# Patient Record
Sex: Female | Born: 1935 | Race: White | Hispanic: No | Smoking: Never smoker
Health system: Southern US, Community
[De-identification: ages and names within clinical notes are randomized; demographics above are authoritative.]

## PROBLEM LIST (undated history)

## (undated) DIAGNOSIS — M810 Age-related osteoporosis without current pathological fracture: Secondary | ICD-10-CM

## (undated) DIAGNOSIS — F0390 Unspecified dementia without behavioral disturbance: Secondary | ICD-10-CM

## (undated) DIAGNOSIS — I10 Essential (primary) hypertension: Secondary | ICD-10-CM

## (undated) DIAGNOSIS — E079 Disorder of thyroid, unspecified: Secondary | ICD-10-CM

## (undated) DIAGNOSIS — F419 Anxiety disorder, unspecified: Secondary | ICD-10-CM

## (undated) DIAGNOSIS — E7800 Pure hypercholesterolemia, unspecified: Secondary | ICD-10-CM

## (undated) HISTORY — PX: ABDOMINAL HYSTERECTOMY: SUR658

## (undated) HISTORY — PX: BREAST BIOPSY: SHX20

## (undated) HISTORY — PX: OTHER SURGICAL HISTORY: SHX169

## (undated) HISTORY — PX: APPENDECTOMY: SHX54

## (undated) HISTORY — DX: Pure hypercholesterolemia, unspecified: E78.00

---

## 2017-04-13 ENCOUNTER — Encounter (HOSPITAL_BASED_OUTPATIENT_CLINIC_OR_DEPARTMENT_OTHER): Payer: Self-pay | Admitting: *Deleted

## 2017-04-13 ENCOUNTER — Emergency Department (HOSPITAL_BASED_OUTPATIENT_CLINIC_OR_DEPARTMENT_OTHER)
Admission: EM | Admit: 2017-04-13 | Discharge: 2017-04-13 | Disposition: A | Discharge status: Home / Self Care | Payer: Medicare Other | Attending: Emergency Medicine | Admitting: Emergency Medicine

## 2017-04-13 DIAGNOSIS — I889 Nonspecific lymphadenitis, unspecified: Secondary | ICD-10-CM | POA: Diagnosis not present

## 2017-04-13 DIAGNOSIS — F0390 Unspecified dementia without behavioral disturbance: Secondary | ICD-10-CM | POA: Diagnosis not present

## 2017-04-13 DIAGNOSIS — I10 Essential (primary) hypertension: Secondary | ICD-10-CM | POA: Insufficient documentation

## 2017-04-13 DIAGNOSIS — Z79899 Other long term (current) drug therapy: Secondary | ICD-10-CM | POA: Insufficient documentation

## 2017-04-13 DIAGNOSIS — M542 Cervicalgia: Secondary | ICD-10-CM | POA: Diagnosis present

## 2017-04-13 HISTORY — DX: Anxiety disorder, unspecified: F41.9

## 2017-04-13 HISTORY — DX: Age-related osteoporosis without current pathological fracture: M81.0

## 2017-04-13 HISTORY — DX: Unspecified dementia, unspecified severity, without behavioral disturbance, psychotic disturbance, mood disturbance, and anxiety: F03.90

## 2017-04-13 HISTORY — DX: Essential (primary) hypertension: I10

## 2017-04-13 HISTORY — DX: Disorder of thyroid, unspecified: E07.9

## 2017-04-13 MED ORDER — CEPHALEXIN 500 MG PO CAPS: 500.0000 mg | ORAL_CAPSULE | Freq: Four times a day (QID) | ORAL | 0 refills | Status: DC

## 2017-04-13 NOTE — ED Triage Notes (Signed)
Pt c/o right swelling around right ear pain pain x 1 day

## 2017-04-13 NOTE — Discharge Instructions (Signed)
Keflex as prescribed.  Ibuprofen 400 mg every 6 hours as needed for pain.  Follow up with your primary Dr. if not improving in the next 1-2 weeks, and return to the emergency department if you develop difficulty breathing or swallowing, or a significant worsening of your symptoms.

## 2017-04-13 NOTE — ED Provider Notes (Signed)
[facility] Provider Note   CSN: [id number] Arrival date & time: 04/13/17  2045     History   Chief Complaint Chief Complaint  Patient presents with  . Neck Pain    HPI Alicia Benitez is a 81 y.o. female.  Patient is a 81 year old female with history of hypertension and thyroid disease presenting for evaluation of pain and swelling to the right neck. This started this morning in the absence of any injury or trauma. She denies any fevers, chills, difficulty eating, difficulty swallowing, or difficulty breathing. She denies any ill contacts and any recent illnesses.   The history is provided by the patient.  Neck Pain   This is a new problem. Episode onset: This morning. The problem occurs constantly. The problem has been gradually worsening. The pain is associated with nothing. There has been no fever. Pain location: Right neck. The quality of the pain is described as aching. The pain is moderate.    Past Medical History:  Diagnosis Date  . Anxiety   . Dementia   . Hypertension   . Osteoporosis   . Thyroid disease     There are no active problems to display for this patient.   No past surgical history on file.  OB History    No data available       Home Medications    Prior to Admission medications   Medication Sig Start Date End Date Taking? Authorizing Provider  alendronate (FOSAMAX) 70 MG tablet Take 70 mg by mouth once a week. Take with a full glass of water on an empty stomach.   Yes [provider]  donepezil (ARICEPT) 10 MG tablet Take 10 mg by mouth at bedtime.   Yes [provider]  levothyroxine (SYNTHROID, LEVOTHROID) 75 MCG tablet Take 75 mcg by mouth daily before breakfast.   Yes [provider]  losartan-hydrochlorothiazide (HYZAAR) 100-25 MG tablet Take 1 tablet by mouth daily.   Yes [provider]  memantine (NAMENDA) 5 MG tablet Take 28 mg by mouth daily.   Yes [provider]  PARoxetine  (PAXIL) 10 MG tablet Take 10 mg by mouth daily.   Yes [provider]  polyethylene glycol (MIRALAX / GLYCOLAX) packet Take 17 g by mouth daily.   Yes [provider]  potassium chloride (MICRO-K) 10 MEQ CR capsule Take 20 mEq by mouth 2 (two) times daily.   Yes [provider]    Family History No family history on file.  Social History Social History  Substance Use Topics  . Smoking status: Not on file  . Smokeless tobacco: Not on file  . Alcohol use Not on file     Allergies   Patient has no known allergies.   Review of Systems Review of Systems  Musculoskeletal: Positive for neck pain.  All other systems reviewed and are negative.    Physical Exam Updated Vital Signs BP (!) 142/75   Pulse 65   Temp 98.5 F (36.9 C)   Resp 16   Ht 5\' 5"  (1.651 m)   Wt 51.3 kg (113 lb)   SpO2 98%   BMI 18.80 kg/m   Physical Exam  Constitutional: She is oriented to person, place, and time. She appears well-developed and well-nourished. No distress.  HENT:  Head: Normocephalic and atraumatic.  Mouth/Throat: Oropharynx is clear and moist.  There is some fullness and swelling to the right lateral neck just adjacent to the angle of the mandible. There is no stridor.  There is no trismus. Oropharynx is clear with no redness, inflammation, or exudate.  Neck: Normal range of motion. Neck supple.  Cardiovascular: Normal rate and regular rhythm.  Exam reveals no gallop and no friction rub.   No murmur heard. Pulmonary/Chest: Effort normal and breath sounds normal. No respiratory distress. She has no wheezes.  Abdominal: Soft. Bowel sounds are normal. She exhibits no distension. There is no tenderness.  Musculoskeletal: Normal range of motion.  Neurological: She is alert and oriented to person, place, and time.  Skin: Skin is warm and dry. She is not diaphoretic.  Nursing note and vitals reviewed.    ED Treatments / Results  Labs (all labs ordered are  listed, but only abnormal results are displayed) Labs Reviewed - No data to display  EKG  EKG Interpretation None       Radiology No results found.  Procedures Procedures (including critical care time)  Medications Ordered in ED Medications - No data to display   Initial Impression / Assessment and Plan / ED Course  I have reviewed the triage vital signs and the nursing notes.  Pertinent labs & imaging results that were available during my care of the patient were reviewed by me and considered in my medical decision making (see chart for details).  This appears to be an inflamed submandibular lymph node. This will be treated with Keflex, anti-inflammatories, and follow-up as needed if not improving.  Final Clinical Impressions(s) / ED Diagnoses   Final diagnoses:  None    New Prescriptions New Prescriptions   No medications on file     Geoffery Lyonselo, Andrewjames Weirauch, MD 04/13/17 2306

## 2018-10-21 DIAGNOSIS — S22009A Unspecified fracture of unspecified thoracic vertebra, initial encounter for closed fracture: Secondary | ICD-10-CM | POA: Insufficient documentation

## 2018-11-08 DIAGNOSIS — S32009A Unspecified fracture of unspecified lumbar vertebra, initial encounter for closed fracture: Secondary | ICD-10-CM | POA: Insufficient documentation

## 2018-11-13 ENCOUNTER — Non-Acute Institutional Stay (SKILLED_NURSING_FACILITY): Payer: Medicare Other | Admitting: Internal Medicine

## 2018-11-13 ENCOUNTER — Encounter: Payer: Self-pay | Admitting: Internal Medicine

## 2018-11-13 DIAGNOSIS — S42191D Fracture of other part of scapula, right shoulder, subsequent encounter for fracture with routine healing: Secondary | ICD-10-CM

## 2018-11-13 DIAGNOSIS — G301 Alzheimer's disease with late onset: Secondary | ICD-10-CM

## 2018-11-13 DIAGNOSIS — E034 Atrophy of thyroid (acquired): Secondary | ICD-10-CM

## 2018-11-13 DIAGNOSIS — F02818 Dementia in other diseases classified elsewhere, unspecified severity, with other behavioral disturbance: Secondary | ICD-10-CM

## 2018-11-13 DIAGNOSIS — R0489 Hemorrhage from other sites in respiratory passages: Secondary | ICD-10-CM | POA: Diagnosis not present

## 2018-11-13 DIAGNOSIS — I10 Essential (primary) hypertension: Secondary | ICD-10-CM

## 2018-11-13 DIAGNOSIS — S2249XA Multiple fractures of ribs, unspecified side, initial encounter for closed fracture: Secondary | ICD-10-CM

## 2018-11-13 DIAGNOSIS — F32A Depression, unspecified: Secondary | ICD-10-CM

## 2018-11-13 DIAGNOSIS — S129XXD Fracture of neck, unspecified, subsequent encounter: Secondary | ICD-10-CM

## 2018-11-13 DIAGNOSIS — K219 Gastro-esophageal reflux disease without esophagitis: Secondary | ICD-10-CM

## 2018-11-13 DIAGNOSIS — F329 Major depressive disorder, single episode, unspecified: Secondary | ICD-10-CM

## 2018-11-13 DIAGNOSIS — F0281 Dementia in other diseases classified elsewhere with behavioral disturbance: Secondary | ICD-10-CM

## 2018-11-13 DIAGNOSIS — S32010D Wedge compression fracture of first lumbar vertebra, subsequent encounter for fracture with routine healing: Secondary | ICD-10-CM

## 2018-11-13 NOTE — Progress Notes (Signed)
:  Location:  [facility] Room Number: 103P Place of Service:  SNF (31)  Alicia Benitez D. Lyn Hollingshead, MD  Patient Care Team: Raynelle Jan., MD as PCP - General (Family Medicine)  Extended Emergency Contact Information Primary Emergency Contact: [place] Home Phone: [phone] Mobile Phone: [phone] Relation: Daughter     Allergies: Phenytoin and Yellow jacket venom [bee venom]  Chief Complaint  Patient presents with  . New Admit To SNF    Admit to [facility]    HPI: Patient is 83 y.o. female with anxiety, dementia, hyperlipidemia, hypertension, osteoporosis, and hypothyroidism who presented to [facility] ED with complaints of injury sustained after presumed fall.  EMS reports that nursing home staff stated the patient was found on the floor yesterday after presumed fall.  They did not seek care for her at that time.  Someone changed her diaper sometime in the hour prior to admission and saw that she had severe bruising to her right hip and right lower back.  In the ED patient was found to have multiple fractures.  Patient was admitted to [facility] from 2/14-18 where she was found to have a closed fracture/dislocation of lumbar spine, not a surgical candidate, thoracic spine fracture, fracture multiple ribs on the right side, and reports dementia.  Patient was treated for pain and there were apparently no complications.  Patient is admitted to skilled nursing facility for OT/PT.  While at skilled nursing facility patient will be followed for hypothyroidism treated with replacement, hypertension treated with lisinopril and metoprolol, and depression treated with Paxil. Past Medical History:  Diagnosis Date  . Anxiety   . Dementia (HCC)   . Hypercholesteremia   . Hypertension   . Osteoporosis   . Thyroid disease       Allergies as of 11/13/2018      Reactions   Phenytoin    Yellow Jacket  Venom [bee Venom]       Medication List       Accurate as of November 13, 2018 11:59 PM. Always use your most recent med list.        acetaminophen 325 MG tablet Commonly known as:  TYLENOL Take 650 mg by mouth every 8 (eight) hours as needed.   famotidine 20 MG tablet Commonly known as:  PEPCID Take 20 mg by mouth 2 (two) times daily.   levothyroxine 88 MCG tablet Commonly known as:  SYNTHROID, LEVOTHROID Take 75 mcg by mouth daily before breakfast.   lisinopril 20 MG tablet Commonly known as:  PRINIVIL,ZESTRIL Take 20 mg by mouth daily.   metoprolol tartrate 25 MG tablet Commonly known as:  LOPRESSOR Take 25 mg by mouth 2 (two) times daily.   pantoprazole 40 MG tablet Commonly known as:  PROTONIX Take 40 mg by mouth daily.   PARoxetine 30 MG tablet Commonly known as:  PAXIL Take 10 mg by mouth daily.   potassium chloride 10 MEQ CR capsule Commonly known as:  MICRO-K Take 20 mEq by mouth 2 (two) times daily.   QUEtiapine 25 MG tablet Commonly known as:  SEROQUEL Take 12.5 mg by mouth 2 (two) times daily.       No orders of the defined types were placed in this encounter.    There is no immunization history on file for this patient.  Social History   Tobacco Use  . Smoking status: Never Smoker  . Smokeless tobacco: Never Used  Substance Use Topics  . Alcohol use: Not on file    Comment: No    Family history is   Family History  Problem Relation Age of Onset  . Hyperlipidemia Mother   . Diabetes Brother   . Cancer Son       Review of Systems  DATA OBTAINED: from patient, nurse GENERAL:  no fevers, fatigue, appetite changes SKIN: No itching, or rash EYES: No eye pain, redness, discharge EARS: No earache, tinnitus, change in hearing NOSE: No congestion, drainage or bleeding  MOUTH/THROAT: No mouth or tooth pain, No sore throat RESPIRATORY: No cough, wheezing, SOB CARDIAC: No chest pain, palpitations, lower extremity edema  GI: No  abdominal pain, No N/V/D or constipation, No heartburn or reflux  GU: No dysuria, frequency or urgency, or incontinence  MUSCULOSKELETAL: No unrelieved bone/joint pain NEUROLOGIC: No headache, dizziness or focal weakness PSYCHIATRIC: No c/o anxiety or sadness   Vitals:   11/13/18 1403  BP: 126/78  Pulse: 63  Resp: 18  Temp: (!) 96.5 F (35.8 C)    SpO2 Readings from Last 1 Encounters:  04/13/17 98%   Body mass index is 18.8 kg/m.     Physical Exam  GENERAL APPEARANCE: Alert, conversant,  No acute distress.  SKIN: No diaphoresis rash HEAD: Normocephalic, atraumatic  EYES: Conjunctiva/lids clear. Pupils round, reactive. EOMs intact.  EARS: External exam WNL, canals clear. Hearing grossly normal.  NOSE: No deformity or discharge.  MOUTH/THROAT: Lips w/o lesions  RESPIRATORY: Breathing is even, unlabored. Lung sounds are clear   CARDIOVASCULAR: Heart RRR no murmurs, rubs or gallops. No peripheral edema.   GASTROINTESTINAL: Abdomen is soft, non-tender, not distended w/ normal bowel sounds. GENITOURINARY: Bladder non tender, not distended  MUSCULOSKELETAL: No abnormal joints or musculature NEUROLOGIC:  Cranial nerves 2-12 grossly intact. Moves all extremities  PSYCHIATRIC: Mood and affect with dementia, no behavioral issues  There are no active problems to display for this patient.     Labs reviewed: Basic Metabolic Panel: No results found for: NA, K, CL, CO2, GLUCOSE, BUN, CREATININE, CALCIUM, PROT, ALBUMIN, AST, ALT, ALKPHOS, BILITOT, GFRNONAA, GFRAA  No results for input(s): NA, K, CL, CO2, GLUCOSE, BUN, CREATININE, CALCIUM, MG, PHOS in the last 8760 hours. Liver Function Tests: No results for input(s): AST, ALT, ALKPHOS, BILITOT, PROT, ALBUMIN in the last 8760 hours. No results for input(s): LIPASE, AMYLASE in the last 8760 hours. No results for input(s): AMMONIA in the last 8760 hours. CBC: No results for input(s): WBC, NEUTROABS, HGB, HCT, MCV, PLT in the last  8760 hours. Lipid No results for input(s): CHOL, HDL, LDLCALC, TRIG in the last 8760 hours.  Cardiac Enzymes: No results for input(s): CKTOTAL, CKMB, CKMBINDEX, TROPONINI in the last 8760 hours. BNP: No results for input(s): BNP in the last 8760 hours. No results found for: MICROALBUR No results found for: HGBA1C No results found for: TSH No results found for: VITAMINB12 No results found for: FOLATE No results found for: IRON, TIBC, FERRITIN  Imaging and Procedures obtained prior to SNF admission: No results found.   Not all labs, radiology exams or other studies done during hospitalization come through on my EPIC note; however they are reviewed by me.    Assessment and Plan  Right rib fractures with displacement 2 - 6/subpleural hemorrhage/T2-T7 right transverse process fractures nondisplaced/right inferior scapular fracture/L1 compression fracture with mild retropulsion- Ortho recommended c-collar to be worn at all times in a Jewett brace to be worn when patient is out of bed SNF-admitted for  OT/PT; follow-up CBC  Hypertension SNF- controlled; continue metoprolol 25 mg twice daily and lisinopril 20 mg daily  Hypothyroidism SNF- not stated as uncontrolled; continue 88 mcg daily  Depression SNF- not stated as uncontrolled; continue Paxil 30 mg daily  GERD SNF- continue Pepcid 20 mg twice daily and Protonix 40 mg daily  Dementia with behaviors SNF- continue with Seroquel 12.5 mg nightly, patient was on Aricept prior but none now   Time spent greater than 45 minutes;> 50% of time with patient was spent reviewing records, labs, tests and studies, counseling and developing plan of care  Thurston Holenne D. Lyn HollingsheadAlexander, MD

## 2018-11-15 ENCOUNTER — Encounter: Payer: Self-pay | Admitting: Internal Medicine

## 2018-11-19 LAB — BASIC METABOLIC PANEL
BUN: 19 (ref 4–21)
CREATININE: 0.7 (ref 0.5–1.1)
Glucose: 92
POTASSIUM: 4.2 (ref 3.4–5.3)
Sodium: 137 (ref 137–147)

## 2018-11-19 LAB — CBC AND DIFFERENTIAL
HCT: 23 — AB (ref 36–46)
HEMOGLOBIN: 7.8 — AB (ref 12.0–16.0)
Platelets: 366 (ref 150–399)
WBC: 4.7

## 2018-12-13 ENCOUNTER — Encounter: Payer: Self-pay | Admitting: Internal Medicine

## 2018-12-13 ENCOUNTER — Non-Acute Institutional Stay (SKILLED_NURSING_FACILITY): Payer: Medicare Other | Admitting: Internal Medicine

## 2018-12-13 DIAGNOSIS — I10 Essential (primary) hypertension: Secondary | ICD-10-CM | POA: Diagnosis not present

## 2018-12-13 DIAGNOSIS — F0281 Dementia in other diseases classified elsewhere with behavioral disturbance: Secondary | ICD-10-CM | POA: Diagnosis not present

## 2018-12-13 DIAGNOSIS — G300 Alzheimer's disease with early onset: Secondary | ICD-10-CM | POA: Diagnosis not present

## 2018-12-13 DIAGNOSIS — F02818 Dementia in other diseases classified elsewhere, unspecified severity, with other behavioral disturbance: Secondary | ICD-10-CM

## 2018-12-13 DIAGNOSIS — E034 Atrophy of thyroid (acquired): Secondary | ICD-10-CM

## 2018-12-13 NOTE — Progress Notes (Signed)
Location:  [facility] Room Number: 103P Place of Service:  SNF (31)  Randon Goldsmith. Lyn Hollingshead, MD  Patient Care Team: Raynelle Jan., MD as PCP - General (Family Medicine)  Extended Emergency Contact Information Primary Emergency Contact: [place] Home Phone: [phone] Mobile Phone: [phone] Relation: Daughter    Allergies: Phenytoin and Yellow jacket venom [bee venom]  Chief Complaint  Patient presents with  . Medical Management of Chronic Issues    Routine visit    HPI: Patient is 83 y.o. female who is being seen for routine issues of hypertension, hypothyroidism, and Alzheimer's disease.  Past Medical History:  Diagnosis Date  . Anxiety   . Dementia (HCC)   . Hypercholesteremia   . Hypertension   . Osteoporosis   . Thyroid disease     Past Surgical History:  Procedure Laterality Date  . ABDOMINAL HYSTERECTOMY    . APPENDECTOMY    . BREAST BIOPSY Left   . excision of meckel diverticulum    . IM Nailing Hip fracture    . salpingophorectomy      Allergies as of 12/13/2018      Reactions   Phenytoin    Yellow Jacket Venom [bee Venom]       Medication List       Accurate as of December 13, 2018 11:59 PM. Always use your most recent med list.        acetaminophen 325 MG tablet Commonly known as:  TYLENOL Take 650 mg by mouth every 8 (eight) hours as needed.   divalproex 125 MG DR tablet Commonly known as:  DEPAKOTE Take 125 mg by mouth 2 (two) times daily.   famotidine 20 MG tablet Commonly known as:  PEPCID Take 20 mg by mouth 2 (two) times daily.   levothyroxine 88 MCG tablet Commonly known as:  SYNTHROID, LEVOTHROID Take 75 mcg by mouth daily before breakfast.   lisinopril 20 MG tablet Commonly known as:  PRINIVIL,ZESTRIL Take 20 mg by mouth daily.   metoprolol tartrate 25 MG tablet Commonly known as:  LOPRESSOR Take 25 mg by mouth 2 (two) times daily.   PARoxetine 30 MG  tablet Commonly known as:  PAXIL Take 10 mg by mouth daily.   potassium chloride 10 MEQ CR capsule Commonly known as:  MICRO-K Take 10 mEq by mouth 2 (two) times daily.       No orders of the defined types were placed in this encounter.    There is no immunization history on file for this patient.  Social History   Tobacco Use  . Smoking status: Never Smoker  . Smokeless tobacco: Never Used  Substance Use Topics  . Alcohol use: Not on file    Comment: No    Review of Systems  DATA OBTAINED: from patient, nurse GENERAL:  no fevers, fatigue, appetite changes SKIN: No itching, rash HEENT: No complaint RESPIRATORY: No cough, wheezing, SOB CARDIAC: No chest pain, palpitations, lower extremity edema  GI: No abdominal pain, No N/V/D or constipation, No heartburn or reflux  GU: No dysuria, frequency or urgency, or incontinence  MUSCULOSKELETAL: No unrelieved bone/joint pain NEUROLOGIC: No headache, dizziness  PSYCHIATRIC: No overt anxiety or sadness  Vitals:   12/13/18 1010  BP: 131/79  Pulse: 69  Resp: 17  Temp: 98 F (36.7 C)   Body mass index is 20.27 kg/m. Physical Exam  GENERAL APPEARANCE: Alert, conversant, No acute distress  SKIN: No diaphoresis rash HEENT:  Unremarkable RESPIRATORY: Breathing is even, unlabored. Lung sounds are clear   CARDIOVASCULAR: Heart RRR no murmurs, rubs or gallops. No peripheral edema  GASTROINTESTINAL: Abdomen is soft, non-tender, not distended w/ normal bowel sounds.  GENITOURINARY: Bladder non tender, not distended  MUSCULOSKELETAL: No abnormal joints or musculature NEUROLOGIC: Cranial nerves 2-12 grossly intact. Moves all extremities PSYCHIATRIC: Mood and affect with dementia, no behavioral issues  Patient Active Problem List   Diagnosis Date Noted  . Compression fracture of L1 lumbar vertebra (HCC) 12/21/2018  . Closed fracture of transverse process of thoracic vertebra (HCC) 12/21/2018  . Hypertension 12/21/2018  .  Hypothyroidism 12/21/2018  . Depression, major, single episode, in partial remission (HCC) 12/21/2018  . GERD (gastroesophageal reflux disease) 12/21/2018  . Dementia with behavioral disturbance (HCC) 12/21/2018  . Multiple rib fractures 12/21/2018  . Lung contusion 12/21/2018  . Closed fracture dislocation of lumbar spine (HCC) 11/08/2018  . Thoracic spine fracture (HCC) 10/21/2018    CMP     Component Value Date/Time   NA 137 11/19/2018   K 4.2 11/19/2018   BUN 19 11/19/2018   CREATININE 0.7 11/19/2018   Recent Labs    11/19/18  NA 137  K 4.2  BUN 19  CREATININE 0.7   No results for input(s): AST, ALT, ALKPHOS, BILITOT, PROT, ALBUMIN in the last 8760 hours. Recent Labs    11/19/18  WBC 4.7  HGB 7.8*  HCT 23*  PLT 366   No results for input(s): CHOL, LDLCALC, TRIG in the last 8760 hours.  Invalid input(s): HCL No results found for: MICROALBUR No results found for: TSH No results found for: HGBA1C No results found for: CHOL, HDL, LDLCALC, LDLDIRECT, TRIG, CHOLHDL  Significant Diagnostic Results in last 30 days:  No results found.  Assessment and Plan  Depression, major, single episode, in partial remission (HCC) Appears controlled; continue Paxil 10 mg daily  Multiple rib fractures Patient appears to be improving without difficulty: Patient has Tylenol only for pain and seems comfortable enough with that  Hypothyroidism Not stated as uncontrolled; continue Synthroid 88 mcg daily     Torria Fromer D. Lyn Hollingshead, MD

## 2018-12-19 ENCOUNTER — Non-Acute Institutional Stay (SKILLED_NURSING_FACILITY): Payer: Medicare Other | Admitting: Adult Health

## 2018-12-19 ENCOUNTER — Encounter: Payer: Self-pay | Admitting: Adult Health

## 2018-12-19 DIAGNOSIS — S22009S Unspecified fracture of unspecified thoracic vertebra, sequela: Secondary | ICD-10-CM | POA: Diagnosis not present

## 2018-12-19 DIAGNOSIS — G300 Alzheimer's disease with early onset: Secondary | ICD-10-CM

## 2018-12-19 DIAGNOSIS — F02818 Dementia in other diseases classified elsewhere, unspecified severity, with other behavioral disturbance: Secondary | ICD-10-CM

## 2018-12-19 DIAGNOSIS — S32009S Unspecified fracture of unspecified lumbar vertebra, sequela: Secondary | ICD-10-CM

## 2018-12-19 DIAGNOSIS — F0281 Dementia in other diseases classified elsewhere with behavioral disturbance: Secondary | ICD-10-CM | POA: Diagnosis not present

## 2018-12-19 NOTE — Progress Notes (Signed)
Location:    [facility] Room Number: 103P Place of Service:  SNF (31)    CODE STATUS: dnr  Allergies  Allergen Reactions  . Phenytoin   . Yellow Jacket Venom [Bee Venom]     Chief Complaint  Patient presents with  . Discharge Note    Discharge from [facility]     HPI:  She is being discharge to assisted living with home health for pt/ot. She will need a semi-electric bed. Her medication and medical follow up will be provided at the receiving facility.  She had been hospitalized after a fall and a lumbar spine fracture. She was admitted to this facility for short term rehab. She is unable to return back home; she is unable to meet her adl needs.      Past Medical History:  Diagnosis Date  . Anxiety   . Dementia (HCC)   . Hypercholesteremia   . Hypertension   . Osteoporosis   . Thyroid disease     Past Surgical History:  Procedure Laterality Date  . ABDOMINAL HYSTERECTOMY    . APPENDECTOMY    . BREAST BIOPSY Left   . excision of meckel diverticulum    . IM Nailing Hip fracture    . salpingophorectomy      Social History   Socioeconomic History  . Marital status: Widowed    Spouse name: Not on file  . Number of children: Not on file  . Years of education: Not on file  . Highest education level: Not on file  Occupational History  . Not on file  Social Needs  . Financial resource strain: Not on file  . Food insecurity:    Worry: Not on file    Inability: Not on file  . Transportation needs:    Medical: Not on file    Non-medical: Not on file  Tobacco Use  . Smoking status: Never Smoker  . Smokeless tobacco: Never Used  Substance and Sexual Activity  . Alcohol use: Not on file    Comment: No  . Drug use: Not on file    Comment: No  . Sexual activity: Not on file  Lifestyle  . Physical activity:    Days per week: Not on file    Minutes per session: Not on file  . Stress: Not on file  Relationships  .  Social connections:    Talks on phone: Not on file    Gets together: Not on file    Attends religious service: Not on file    Active member of club or organization: Not on file    Attends meetings of clubs or organizations: Not on file    Relationship status: Not on file  . Intimate partner violence:    Fear of current or ex partner: Not on file    Emotionally abused: Not on file    Physically abused: Not on file    Forced sexual activity: Not on file  Other Topics Concern  . Not on file  Social History Narrative  . Not on file   Family History  Problem Relation Age of Onset  . Hyperlipidemia Mother   . Diabetes Brother   . Cancer Son     VITAL SIGNS BP 111/74   Pulse 74   Temp 97.8 F (36.6 C)   Resp 18   Ht 5\' 5"  (1.651 m)   Wt 121 lb 12.8 oz (55.2 kg)   BMI 20.27 kg/m  Patient's Medications  New Prescriptions   No medications on file  Previous Medications   ACETAMINOPHEN (TYLENOL) 325 MG TABLET    Take 650 mg by mouth every 8 (eight) hours as needed.   DIVALPROEX (DEPAKOTE) 125 MG DR TABLET    Take 125 mg by mouth 2 (two) times daily.   FAMOTIDINE (PEPCID) 20 MG TABLET    Take 20 mg by mouth 2 (two) times daily.   LEVOTHYROXINE (SYNTHROID, LEVOTHROID) 88 MCG TABLET    Take 75 mcg by mouth daily before breakfast.    LISINOPRIL (PRINIVIL,ZESTRIL) 20 MG TABLET    Take 20 mg by mouth daily.   METOPROLOL TARTRATE (LOPRESSOR) 25 MG TABLET    Take 25 mg by mouth 2 (two) times daily.   PAROXETINE (PAXIL) 30 MG TABLET    Take 10 mg by mouth daily.    POTASSIUM CHLORIDE (MICRO-K) 10 MEQ CR CAPSULE    Take 10 mEq by mouth 2 (two) times daily.   Modified Medications   No medications on file  Discontinued Medications   No medications on file     SIGNIFICANT DIAGNOSTIC EXAMS  LABS REVIEWED TODAY:   11-19-18: wbc 4.7; hgb 7.8; hct 23; plt 366; glucose 92; bun 19; creat 0.7 ;k+ 4.2; na++ 137   Review of Systems  Unable to perform ROS: Dementia (unable to participate )     Physical Exam Constitutional:      General: She is not in acute distress.    Appearance: She is well-developed. She is not diaphoretic.     Comments: Frail   Neck:     Musculoskeletal: Neck supple.     Thyroid: No thyromegaly.  Cardiovascular:     Rate and Rhythm: Normal rate and regular rhythm.     Pulses: Normal pulses.     Heart sounds: Normal heart sounds.  Pulmonary:     Effort: Pulmonary effort is normal. No respiratory distress.     Breath sounds: Normal breath sounds.  Abdominal:     General: Bowel sounds are normal. There is no distension.     Palpations: Abdomen is soft.     Tenderness: There is no abdominal tenderness.  Musculoskeletal:     Right lower leg: No edema.     Left lower leg: No edema.     Comments: Is able to move all extremities Wearing back brace   Lymphadenopathy:     Cervical: No cervical adenopathy.  Skin:    General: Skin is warm and dry.  Neurological:     Mental Status: She is alert. Mental status is at baseline.  Psychiatric:        Mood and Affect: Mood normal.       ASSESSMENT/ PLAN:  Patient is being discharged with the following home health services:  Pt/ot to evaluate and treat as indicated for gait balance strength adl training   Patient is being discharged with the following durable medical equipment:  Semi-electric bed due to lumbar/thoracic spine fractures. Requires for position changes; mobility which cannot be achieved in a standard bed.   Patient has been advised to f/u with their PCP in 1-2 weeks to bring them up to date on their rehab stay.  Social services at facility was responsible for arranging this appointment.  Pt was provided with a 30 day supply of prescriptions for medications and refills must be obtained from their PCP.  For controlled substances, a more limited supply may be provided adequate until PCP appointment only.   Medications  will be provided by the receiving facility   Time spent with patient and  discharge process: 40 minutes: for dme and home health needs.    Synthia Innocent NP [facility]  Contact [phone] Monday through Friday 8am- 5pm  After hours call [phone]

## 2018-12-21 ENCOUNTER — Encounter: Payer: Self-pay | Admitting: Internal Medicine

## 2018-12-21 DIAGNOSIS — S27329A Contusion of lung, unspecified, initial encounter: Secondary | ICD-10-CM | POA: Insufficient documentation

## 2018-12-21 DIAGNOSIS — S2249XA Multiple fractures of ribs, unspecified side, initial encounter for closed fracture: Secondary | ICD-10-CM | POA: Insufficient documentation

## 2018-12-21 DIAGNOSIS — F324 Major depressive disorder, single episode, in partial remission: Secondary | ICD-10-CM | POA: Insufficient documentation

## 2018-12-21 DIAGNOSIS — K219 Gastro-esophageal reflux disease without esophagitis: Secondary | ICD-10-CM | POA: Insufficient documentation

## 2018-12-21 DIAGNOSIS — I10 Essential (primary) hypertension: Secondary | ICD-10-CM | POA: Insufficient documentation

## 2018-12-21 DIAGNOSIS — E039 Hypothyroidism, unspecified: Secondary | ICD-10-CM | POA: Insufficient documentation

## 2018-12-21 DIAGNOSIS — S32010A Wedge compression fracture of first lumbar vertebra, initial encounter for closed fracture: Secondary | ICD-10-CM | POA: Insufficient documentation

## 2018-12-21 DIAGNOSIS — S22009A Unspecified fracture of unspecified thoracic vertebra, initial encounter for closed fracture: Secondary | ICD-10-CM | POA: Insufficient documentation

## 2018-12-21 DIAGNOSIS — F0391 Unspecified dementia with behavioral disturbance: Secondary | ICD-10-CM | POA: Insufficient documentation

## 2018-12-21 DIAGNOSIS — F03918 Unspecified dementia, unspecified severity, with other behavioral disturbance: Secondary | ICD-10-CM | POA: Insufficient documentation

## 2018-12-21 NOTE — Assessment & Plan Note (Signed)
Appears controlled; continue Paxil 10 mg daily

## 2018-12-21 NOTE — Assessment & Plan Note (Signed)
Patient appears to be improving without difficulty: Patient has Tylenol only for pain and seems comfortable enough with that

## 2018-12-21 NOTE — Assessment & Plan Note (Signed)
Not stated as uncontrolled; continue Synthroid 88 mcg daily

## 2019-01-10 ENCOUNTER — Encounter: Payer: Self-pay | Admitting: Internal Medicine

## 2019-01-10 ENCOUNTER — Non-Acute Institutional Stay (SKILLED_NURSING_FACILITY): Payer: Medicare Other | Admitting: Internal Medicine

## 2019-01-10 DIAGNOSIS — G300 Alzheimer's disease with early onset: Secondary | ICD-10-CM

## 2019-01-10 DIAGNOSIS — I10 Essential (primary) hypertension: Secondary | ICD-10-CM

## 2019-01-10 DIAGNOSIS — F0281 Dementia in other diseases classified elsewhere with behavioral disturbance: Secondary | ICD-10-CM | POA: Diagnosis not present

## 2019-01-10 DIAGNOSIS — K219 Gastro-esophageal reflux disease without esophagitis: Secondary | ICD-10-CM | POA: Diagnosis not present

## 2019-01-10 NOTE — Progress Notes (Signed)
[facility] Room Number: [id number] Place of Service:  SNF [phone]  Randon Goldsmithnne D. Lyn HollingsheadAlexander, MD  Patient Care Team: Raynelle JanSpry, Heather M., MD as PCP - General (Family Medicine)  Extended Emergency Contact Information Primary Emergency Contact: Providence Crosbyollins,Kay  [place] Home Phone: [phone] Mobile Phone: [phone] Relation: Daughter    Allergies: Phenytoin and Yellow jacket venom [bee venom]  Chief Complaint  Patient presents with  . Medical Management of Chronic Issues    Routine visit    HPI: Patient is 83 y.o. female who is being seen for routine issues of hypertension, GERD, and dementia with behaviors.  Past Medical History:  Diagnosis Date  . Anxiety   . Dementia (HCC)   . Hypercholesteremia   . Hypertension   . Osteoporosis   . Thyroid disease     Past Surgical History:  Procedure Laterality Date  . ABDOMINAL HYSTERECTOMY    . APPENDECTOMY    . BREAST BIOPSY Left   . excision of meckel diverticulum    . IM Nailing Hip fracture    . salpingophorectomy      Allergies as of 01/10/2019      Reactions   Phenytoin    Yellow Jacket Venom [bee Venom]       Medication List       Accurate as of January 10, 2019 11:59 PM. Always use your most recent med list.        acetaminophen 325 MG tablet Commonly known as:  TYLENOL Take 650 mg by mouth every 8 (eight) hours as needed.   bisacodyl 10 MG suppository Commonly known as:  DULCOLAX Place 10 mg rectally as needed for moderate constipation.   divalproex 125 MG DR tablet Commonly known as:  DEPAKOTE Take 125 mg by mouth 2 (two) times daily.   famotidine 20 MG tablet Commonly known as:  PEPCID Take 20 mg by mouth 2 (two) times daily.   levothyroxine 88 MCG tablet Commonly known as:  SYNTHROID Take 75 mcg by mouth daily before breakfast.   lisinopril 20 MG tablet Commonly known as:  ZESTRIL Take 20 mg by mouth daily.   magnesium hydroxide 400 MG/5ML  suspension Commonly known as:  MILK OF MAGNESIA Take 30 mLs by mouth daily as needed for mild constipation.   metoprolol tartrate 25 MG tablet Commonly known as:  LOPRESSOR Take 25 mg by mouth 2 (two) times daily.   PARoxetine 30 MG tablet Commonly known as:  PAXIL Take 30 mg by mouth daily.   potassium chloride 10 MEQ CR capsule Commonly known as:  MICRO-K Take 10 mEq by mouth 2 (two) times daily.       No orders of the defined types were placed in this encounter.    There is no immunization history on file for this patient.  Social History   Tobacco Use  . Smoking status: Never Smoker  . Smokeless tobacco: Never Used  Substance Use Topics  . Alcohol use: Not on file    Comment: No    Review of Systems  DATA OBTAINED: from patient, nursE GENERAL:  no fevers, fatigue, appetite changes SKIN: No itching, rash HEENT: No complaint RESPIRATORY: No cough, wheezing, SOB CARDIAC: No chest pain, palpitations, lower extremity edema  GI: No abdominal pain, No N/V/D or constipation, No heartburn or reflux  GU: No dysuria, frequency or urgency, or incontinence  MUSCULOSKELETAL: No unrelieved bone/joint pain NEUROLOGIC: No headache, dizziness  PSYCHIATRIC: No overt anxiety or sadness  Vitals:   01/10/19 1123  BP: 118/72  Pulse: 74  Resp: 18  Temp: 98 F (36.7 C)   Body mass index is 19.67 kg/m. Physical Exam  GENERAL APPEARANCE: Alert, conversant, No acute distress; wearing neck and back brace SKIN: No diaphoresis rash HEENT: Unremarkable RESPIRATORY: Breathing is even, unlabored. Lung sounds are clear   CARDIOVASCULAR: Heart RRR no murmurs, rubs or gallops. No peripheral edema  GASTROINTESTINAL: Abdomen is soft, non-tender, not distended w/ normal bowel sounds.  GENITOURINARY: Bladder non tender, not distended  MUSCULOSKELETAL: No abnormal joints or musculature NEUROLOGIC: Cranial nerves 2-12 grossly intact. Moves all extremities PSYCHIATRIC: Mood and affect  appropriate to situation with dementia, no behavioral issues  Patient Active Problem List   Diagnosis Date Noted  . Compression fracture of L1 lumbar vertebra (HCC) 12/21/2018  . Closed fracture of transverse process of thoracic vertebra (HCC) 12/21/2018  . Hypertension 12/21/2018  . Hypothyroidism 12/21/2018  . Depression, major, single episode, in partial remission (HCC) 12/21/2018  . GERD (gastroesophageal reflux disease) 12/21/2018  . Dementia with behavioral disturbance (HCC) 12/21/2018  . Multiple rib fractures 12/21/2018  . Lung contusion 12/21/2018  . Closed fracture dislocation of lumbar spine (HCC) 11/08/2018  . Thoracic spine fracture (HCC) 10/21/2018    CMP     Component Value Date/Time   NA 137 11/19/2018   K 4.2 11/19/2018   BUN 19 11/19/2018   CREATININE 0.7 11/19/2018   Recent Labs    11/19/18  NA 137  K 4.2  BUN 19  CREATININE 0.7   No results for input(s): AST, ALT, ALKPHOS, BILITOT, PROT, ALBUMIN in the last 8760 hours. Recent Labs    11/19/18  WBC 4.7  HGB 7.8*  HCT 23*  PLT 366   No results for input(s): CHOL, LDLCALC, TRIG in the last 8760 hours.  Invalid input(s): HCL No results found for: MICROALBUR No results found for: TSH No results found for: HGBA1C No results found for: CHOL, HDL, LDLCALC, LDLDIRECT, TRIG, CHOLHDL  Significant Diagnostic Results in last 30 days:  No results found.  Assessment and Plan  Hypertension Chronic and stable; continue lisinopril 20 mg daily and metoprolol 25 mg twice daily  GERD (gastroesophageal reflux disease) Chronic and stable; continue Pepcid 20 mg twice daily  Dementia with behavioral disturbance (HCC) No meds for dementia per se is on meds for behaviors including Depakote 125 mg twice daily Depakote 125 mg twice daily and Paxil 30 mg daily; continue supportive care    Mishel Sans D. Lyn Hollingshead, MD

## 2019-01-12 ENCOUNTER — Encounter: Payer: Self-pay | Admitting: Internal Medicine

## 2019-01-12 NOTE — Assessment & Plan Note (Signed)
Chronic and stable; continue Pepcid 20 mg twice daily 

## 2019-01-12 NOTE — Assessment & Plan Note (Signed)
Chronic and stable; continue lisinopril 20 mg daily and metoprolol 25 mg twice daily

## 2019-01-12 NOTE — Assessment & Plan Note (Signed)
No meds for dementia per se is on meds for behaviors including Depakote 125 mg twice daily Depakote 125 mg twice daily and Paxil 30 mg daily; continue supportive care

## 2019-02-06 ENCOUNTER — Non-Acute Institutional Stay (SKILLED_NURSING_FACILITY): Payer: Medicare Other | Admitting: Internal Medicine

## 2019-02-06 ENCOUNTER — Encounter: Payer: Self-pay | Admitting: Internal Medicine

## 2019-02-06 DIAGNOSIS — F324 Major depressive disorder, single episode, in partial remission: Secondary | ICD-10-CM | POA: Diagnosis not present

## 2019-02-06 DIAGNOSIS — S32010S Wedge compression fracture of first lumbar vertebra, sequela: Secondary | ICD-10-CM | POA: Diagnosis not present

## 2019-02-06 DIAGNOSIS — S2241XS Multiple fractures of ribs, right side, sequela: Secondary | ICD-10-CM

## 2019-02-06 NOTE — Progress Notes (Signed)
Location[facility] Room Number: [id number] Place of Service:  SNF (31)  Spry, Geroge BasemanHeather M., MD  Patient Care Team: Raynelle JanSpry, Heather M., MD as PCP - General (Family Medicine)  Extended Emergency Contact Information Primary Emergency Contact: [place] Crosbyollins,Kay  [place] Home Phone: [phone] Mobile Phone: [phone] Relation: Daughter    Allergies: Phenytoin and Yellow jacket venom [bee venom]  Chief Complaint  Patient presents with  . Medical Management of Chronic Issues    Routine [facility] visit    HPI: Patient is an 83 y.o. female who is being seen for routine issues of depression, multiple rib fractures, and L1 thoracic spine fracture.  Past Medical History:  Diagnosis Date  . Anxiety   . Dementia (HCC)   . Hypercholesteremia   . Hypertension   . Osteoporosis   . Thyroid disease     Past Surgical History:  Procedure Laterality Date  . ABDOMINAL HYSTERECTOMY    . APPENDECTOMY    . BREAST BIOPSY Left   . excision of meckel diverticulum    . IM Nailing Hip fracture    . salpingophorectomy      Allergies as of 02/06/2019      Reactions   Phenytoin    Yellow Jacket Venom [bee Venom]       Medication List       Accurate as of Feb 06, 2019 11:59 PM. If you have any questions, ask your nurse or doctor.        acetaminophen 325 MG tablet Commonly known as:  TYLENOL Take 650 mg by mouth every 8 (eight) hours as needed for mild pain or moderate pain.   bisacodyl 10 MG suppository Commonly known as:  DULCOLAX Place 10 mg rectally as needed for moderate constipation.   divalproex 125 MG DR tablet Commonly known as:  DEPAKOTE Take 125 mg by mouth 2 (two) times daily.   famotidine 20 MG tablet Commonly known as:  PEPCID Take 20 mg by mouth 2 (two) times daily.   levothyroxine 88 MCG tablet Commonly known as:  SYNTHROID Take 75 mcg by mouth daily before breakfast.   lisinopril 20 MG tablet Commonly known  as:  ZESTRIL Take 20 mg by mouth daily.   magnesium hydroxide 400 MG/5ML suspension Commonly known as:  MILK OF MAGNESIA Take 30 mLs by mouth daily as needed for mild constipation.   metoprolol tartrate 25 MG tablet Commonly known as:  LOPRESSOR Take 25 mg by mouth 2 (two) times daily.   Nutritional Supplement Liqd Take 4 oz by mouth 2 (two) times a day. MedPass   PARoxetine 30 MG tablet Commonly known as:  PAXIL Take 30 mg by mouth daily.   potassium chloride 10 MEQ CR capsule Commonly known as:  MICRO-K Take 10 mEq by mouth 2 (two) times daily.       No orders of the defined types were placed in this encounter.   Immunization History  Administered Date(s) Administered  . Influenza-Unspecified 07/26/2018  . Pneumococcal Conjugate-13 11/23/2013  . Pneumococcal Polysaccharide-23 02/23/2017  . Tdap 07/27/2011  . Zoster 11/24/2007    Social History   Tobacco Use  . Smoking status: Never Smoker  . Smokeless tobacco: Never Used  Substance Use Topics  . Alcohol use: Not on file    Comment: No    Review of Systems  DATA OBTAINED: from patient, nurse GENERAL:  no fevers, fatigue, appetite changes SKIN: No itching, rash HEENT: No complaint RESPIRATORY: No  cough, wheezing, SOB CARDIAC: No chest pain, palpitations, lower extremity edema  GI: No abdominal pain, No N/V/D or constipation, No heartburn or reflux  GU: No dysuria, frequency or urgency, or incontinence  MUSCULOSKELETAL: No unrelieved bone/joint pain NEUROLOGIC: No headache, dizziness  PSYCHIATRIC: No overt anxiety or sadness  Vitals:   02/06/19 1141  BP: 121/78  Pulse: 78  Resp: 19  Temp: (!) 97.5 F (36.4 C)  SpO2: 98%   Body mass index is 19.67 kg/m. Physical Exam  GENERAL APPEARANCE: Alert, conversant, No acute distress  SKIN: No diaphoresis rash HEENT: Unremarkable RESPIRATORY: Breathing is even, unlabored. Lung sounds are clear, breathes comfortably CARDIOVASCULAR: Heart RRR no  murmurs, rubs or gallops.  Base peripheral edema  GASTROINTESTINAL: Abdomen is soft, non-tender, not distended w/ normal bowel sounds.  GENITOURINARY: Bladder non tender, not distended  MUSCULOSKELETAL: No abnormal joints or musculature; patient is able to sit and lie with comfort NEUROLOGIC: Cranial nerves 2-12 grossly intact. Moves all extremities PSYCHIATRIC: Mood and affect appropriate to situation with dementia, no behavioral issues  Patient Active Problem List   Diagnosis Date Noted  . Compression fracture of L1 lumbar vertebra (HCC) 12/21/2018  . Closed fracture of transverse process of thoracic vertebra (HCC) 12/21/2018  . Hypertension 12/21/2018  . Hypothyroidism 12/21/2018  . Depression, major, single episode, in partial remission (HCC) 12/21/2018  . GERD (gastroesophageal reflux disease) 12/21/2018  . Dementia with behavioral disturbance (HCC) 12/21/2018  . Multiple rib fractures 12/21/2018  . Lung contusion 12/21/2018  . Closed fracture dislocation of lumbar spine (HCC) 11/08/2018  . Thoracic spine fracture (HCC) 10/21/2018    CMP     Component Value Date/Time   NA 137 11/19/2018   K 4.2 11/19/2018   BUN 19 11/19/2018   CREATININE 0.7 11/19/2018   Recent Labs    11/19/18  NA 137  K 4.2  BUN 19  CREATININE 0.7   No results for input(s): AST, ALT, ALKPHOS, BILITOT, PROT, ALBUMIN in the last 8760 hours. Recent Labs    11/19/18  WBC 4.7  HGB 7.8*  HCT 23*  PLT 366   No results for input(s): CHOL, LDLCALC, TRIG in the last 8760 hours.  Invalid input(s): HCL No results found for: MICROALBUR No results found for: TSH No results found for: HGBA1C No results found for: CHOL, HDL, LDLCALC, LDLDIRECT, TRIG, CHOLHDL  Significant Diagnostic Results in last 30 days:  No results found.  Assessment and Plan  Depression, major, single episode, in partial remission (HCC) ; On Paxil 10 mg daily and appears controlled; continue current regimen  Multiple rib  fractures Patient surprisingly appears comfortable on Tylenol only; and is progressing well  Compression fracture of L1 lumbar vertebra (HCC) Appears to be healing per recent visit to orthopedic surgeons which is excellent; continue supportive care    Jesenia Spera D. Lyn Hollingshead, MD

## 2019-02-08 ENCOUNTER — Encounter: Payer: Self-pay | Admitting: Internal Medicine

## 2019-02-08 NOTE — Assessment & Plan Note (Signed)
Appears to be healing per recent visit to orthopedic surgeons which is excellent; continue supportive care

## 2019-02-08 NOTE — Assessment & Plan Note (Signed)
;   On Paxil 10 mg daily and appears controlled; continue current regimen

## 2019-02-08 NOTE — Assessment & Plan Note (Signed)
Patient surprisingly appears comfortable on Tylenol only; and is progressing well

## 2019-03-05 ENCOUNTER — Non-Acute Institutional Stay (SKILLED_NURSING_FACILITY): Payer: Medicare Other | Admitting: Internal Medicine

## 2019-03-05 ENCOUNTER — Encounter: Payer: Self-pay | Admitting: Internal Medicine

## 2019-03-05 DIAGNOSIS — F0281 Dementia in other diseases classified elsewhere with behavioral disturbance: Secondary | ICD-10-CM | POA: Diagnosis not present

## 2019-03-05 DIAGNOSIS — F39 Unspecified mood [affective] disorder: Secondary | ICD-10-CM

## 2019-03-05 DIAGNOSIS — G300 Alzheimer's disease with early onset: Secondary | ICD-10-CM

## 2019-03-05 DIAGNOSIS — E034 Atrophy of thyroid (acquired): Secondary | ICD-10-CM

## 2019-03-05 NOTE — Progress Notes (Signed)
[facility] Room Number: [phone] Place of Service:  SNF (31)  Spry, Geroge BasemanHeather M., MD  Patient Care Team: Raynelle JanSpry, Heather M., MD as PCP - General (Family Medicine)  Extended Emergency Contact Information Primary Emergency Contact: [place] Crosbyollins,Kay  [place] Home Phone: [phone] Mobile Phone: [phone] Relation: Daughter    Allergies: Phenytoin and Yellow jacket venom [bee venom]  Chief Complaint  Patient presents with  . Medical Management of Chronic Issues    Routine Visit  . Best Practice Recommendations    Due for Dexascan    HPI: Patient is 83 y.o. female who is being seen for routine issues of hypothyroidism, mood disorder, and dementia.  Past Medical History:  Diagnosis Date  . Anxiety   . Dementia (HCC)   . Hypercholesteremia   . Hypertension   . Osteoporosis   . Thyroid disease     Past Surgical History:  Procedure Laterality Date  . ABDOMINAL HYSTERECTOMY    . APPENDECTOMY    . BREAST BIOPSY Left   . excision of meckel diverticulum    . IM Nailing Hip fracture    . salpingophorectomy      Allergies as of 03/05/2019      Reactions   Phenytoin    Yellow Jacket Venom [bee Venom]       Medication List       Accurate as of March 05, 2019 11:59 PM. If you have any questions, ask your nurse or doctor.        acetaminophen 325 MG tablet Commonly known as: TYLENOL Take 650 mg by mouth every 8 (eight) hours as needed for mild pain or moderate pain.   bisacodyl 10 MG suppository Commonly known as: DULCOLAX Place 10 mg rectally as needed for moderate constipation.   divalproex 125 MG DR tablet Commonly known as: DEPAKOTE Take 125 mg by mouth 2 (two) times daily.   famotidine 20 MG tablet Commonly known as: PEPCID Take 20 mg by mouth 2 (two) times daily.   levothyroxine 88 MCG tablet Commonly known as: SYNTHROID Take 88 mcg by mouth daily before breakfast.   lisinopril 20 MG tablet  Commonly known as: ZESTRIL Take 20 mg by mouth daily.   magnesium hydroxide 400 MG/5ML suspension Commonly known as: MILK OF MAGNESIA Take 30 mLs by mouth daily as needed for mild constipation.   metoprolol tartrate 25 MG tablet Commonly known as: LOPRESSOR Take 25 mg by mouth 2 (two) times daily.   NON FORMULARY Diet Type:  Regular, Liberalized diet   Nutritional Supplement Liqd Take 4 oz by mouth 2 (two) times a day. MedPass   PARoxetine 30 MG tablet Commonly known as: PAXIL Take 30 mg by mouth daily.   potassium chloride 10 MEQ CR capsule Commonly known as: MICRO-K Take 10 mEq by mouth 2 (two) times daily.       No orders of the defined types were placed in this encounter.   Immunization History  Administered Date(s) Administered  . Influenza-Unspecified 07/26/2018  . Pneumococcal Conjugate-13 11/23/2013  . Pneumococcal Polysaccharide-23 02/23/2017  . Tdap 07/27/2011  . Zoster 11/24/2007    Social History   Tobacco Use  . Smoking status: Never Smoker  . Smokeless tobacco: Never Used  Substance Use Topics  . Alcohol use: Not on file    Comment: No    Review of Systems  DATA OBTAINED: from patient, nurse GENERAL:  no fevers, fatigue, appetite changes SKIN: No itching, rash HEENT:  No complaint RESPIRATORY: No cough, wheezing, SOB CARDIAC: No chest pain, palpitations, lower extremity edema  GI: No abdominal pain, No N/V/D or constipation, No heartburn or reflux  GU: No dysuria, frequency or urgency, or incontinence  MUSCULOSKELETAL: No unrelieved bone/joint pain NEUROLOGIC: No headache, dizziness  PSYCHIATRIC: No overt anxiety or sadness  Vitals:   03/05/19 0856  BP: 120/82  Pulse: 69  Resp: 20  Temp: (!) 96.9 F (36.1 C)   Body mass index is 17.94 kg/m. Physical Exam  GENERAL APPEARANCE: Alert, conversant, No acute distress  SKIN: No diaphoresis rash HEENT: Unremarkable RESPIRATORY: Breathing is even, unlabored. Lung sounds are clear    CARDIOVASCULAR: Heart RRR no murmurs, rubs or gallops. No peripheral edema  GASTROINTESTINAL: Abdomen is soft, non-tender, not distended w/ normal bowel sounds.  GENITOURINARY: Bladder non tender, not distended  MUSCULOSKELETAL: No abnormal joints or musculature NEUROLOGIC: Cranial nerves 2-12 grossly intact. Moves all extremities PSYCHIATRIC: Mood and affect appropriate to situation with dementia, no behavioral issues  Patient Active Problem List   Diagnosis Date Noted  . Mood disorder ([place]) 03/08/2019  . Compression fracture of L1 lumbar vertebra ([place]) 12/21/2018  . Closed fracture of transverse process of thoracic vertebra ([place]) 12/21/2018  . Hypertension 12/21/2018  . Hypothyroidism 12/21/2018  . Depression, major, single episode, in partial remission ([place]) 12/21/2018  . GERD (gastroesophageal reflux disease) 12/21/2018  . Dementia with behavioral disturbance ([place]) 12/21/2018  . Multiple rib fractures 12/21/2018  . Lung contusion 12/21/2018  . Closed fracture dislocation of lumbar spine ([place]) 11/08/2018  . Thoracic spine fracture ([place]) 10/21/2018    CMP     Component Value Date/Time   NA 137 11/19/2018   K 4.2 11/19/2018   BUN 19 11/19/2018   CREATININE 0.7 11/19/2018   Recent Labs    11/19/18  NA 137  K 4.2  BUN 19  CREATININE 0.7   No results for input(s): AST, ALT, ALKPHOS, BILITOT, PROT, ALBUMIN in the last 8760 hours. Recent Labs    11/19/18  WBC 4.7  HGB 7.8*  HCT 23*  PLT 366   No results for input(s): CHOL, LDLCALC, TRIG in the last 8760 hours.  Invalid input(s): HCL No results found for: MICROALBUR No results found for: TSH No results found for: HGBA1C No results found for: CHOL, HDL, LDLCALC, LDLDIRECT, TRIG, CHOLHDL  Significant Diagnostic Results in last 30 days:  No results found.  Assessment and Plan  Hypothyroidism On Synthroid 88 mcg daily; continue Synthroid 88 mcg daily  Mood disorder (HCC) Chronic and stable; continue Depakote 125  mg twice daily  Dementia with behavioral disturbance (HCC) Chronic and stable; on no meds; continue supportive care     Hennie Duos, MD

## 2019-03-08 ENCOUNTER — Encounter: Payer: Self-pay | Admitting: Internal Medicine

## 2019-03-08 DIAGNOSIS — F39 Unspecified mood [affective] disorder: Secondary | ICD-10-CM | POA: Insufficient documentation

## 2019-03-08 NOTE — Assessment & Plan Note (Signed)
Chronic and stable; continue Depakote 125 mg twice daily

## 2019-03-08 NOTE — Assessment & Plan Note (Signed)
Chronic and stable; on no meds; continue supportive care 

## 2019-03-08 NOTE — Assessment & Plan Note (Signed)
On Synthroid 88 mcg daily; continue Synthroid 88 mcg daily

## 2019-04-02 ENCOUNTER — Non-Acute Institutional Stay (SKILLED_NURSING_FACILITY): Payer: Medicare Other | Admitting: Internal Medicine

## 2019-04-02 ENCOUNTER — Encounter: Payer: Self-pay | Admitting: Internal Medicine

## 2019-04-02 DIAGNOSIS — K219 Gastro-esophageal reflux disease without esophagitis: Secondary | ICD-10-CM | POA: Diagnosis not present

## 2019-04-02 DIAGNOSIS — I10 Essential (primary) hypertension: Secondary | ICD-10-CM | POA: Diagnosis not present

## 2019-04-02 DIAGNOSIS — F324 Major depressive disorder, single episode, in partial remission: Secondary | ICD-10-CM | POA: Diagnosis not present

## 2019-04-02 NOTE — Progress Notes (Signed)
Location:  [facility] Room Number: 204-W Place of Service:  SNF (31)  Spry, Marsh Dolly., MD  Patient Care Team: Verdell Carmine., MD as PCP - General (Family Medicine)  Extended Emergency Contact Information Primary Emergency Contact: Vonna Kotyk [place] Phone: [phone] Mobile Phone: [phone] Relation: Daughter    Allergies: Phenytoin and Yellow jacket venom [bee venom]  Chief Complaint  Patient presents with  . Medical Management of Chronic Issues    Routine [facility] visit    HPI: Patient is an 83 y.o. female who is being seen for routine issues of hypertension, GERD, and depression.  Past Medical History:  Diagnosis Date  . Anxiety   . Dementia ([place])   . Hypercholesteremia   . Hypertension   . Osteoporosis   . Thyroid disease     Past Surgical History:  Procedure Laterality Date  . ABDOMINAL HYSTERECTOMY    . APPENDECTOMY    . BREAST BIOPSY Left   . excision of meckel diverticulum    . IM Nailing Hip fracture    . salpingophorectomy      Allergies as of 04/02/2019      Reactions   Phenytoin    Yellow Jacket Venom [bee Venom]       Medication List       Accurate as of April 02, 2019 11:59 PM. If you have any questions, ask your nurse or doctor.        acetaminophen 325 MG tablet Commonly known as: TYLENOL Take 650 mg by mouth every 8 (eight) hours as needed for mild pain or moderate pain.   bisacodyl 10 MG suppository Commonly known as: DULCOLAX Place 10 mg rectally as needed for moderate constipation.   divalproex 125 MG DR tablet Commonly known as: DEPAKOTE Take 125 mg by mouth 2 (two) times daily.   famotidine 20 MG tablet Commonly known as: PEPCID Take 20 mg by mouth 2 (two) times daily.   levothyroxine 88 MCG tablet Commonly known as: SYNTHROID Take 88 mcg by mouth daily before breakfast.   lisinopril 20 MG tablet Commonly known as: ZESTRIL Take 20 mg by mouth  daily.   magnesium hydroxide 400 MG/5ML suspension Commonly known as: MILK OF MAGNESIA Take 30 mLs by mouth daily as needed for mild constipation.   metoprolol tartrate 25 MG tablet Commonly known as: LOPRESSOR Take 25 mg by mouth 2 (two) times daily.   NON FORMULARY Diet Type:  Regular, Liberalized diet   Nutritional Supplement Liqd Take 4 oz by mouth 2 (two) times a day. MedPass   PARoxetine 30 MG tablet Commonly known as: PAXIL Take 30 mg by mouth daily.   potassium chloride 10 MEQ CR capsule Commonly known as: MICRO-K Take 10 mEq by mouth 2 (two) times daily.       No orders of the defined types were placed in this encounter.   Immunization History  Administered Date(s) Administered  . Influenza-Unspecified 07/26/2018  . Pneumococcal Conjugate-13 11/23/2013  . Pneumococcal Polysaccharide-23 02/23/2017  . Tdap 07/27/2011  . Zoster 11/24/2007    Social History   Tobacco Use  . Smoking status: Never Smoker  . Smokeless tobacco: Never Used  Substance Use Topics  . Alcohol use: Not on file    Comment: No    Review of Systems  DATA OBTAINED: from patient, nurse GENERAL:  no fevers, fatigue, appetite changes SKIN: No itching, rash HEENT: No complaint RESPIRATORY: No cough, wheezing, SOB CARDIAC: No  chest pain, palpitations, lower extremity edema  GI: No abdominal pain, No N/V/D or constipation, No heartburn or reflux  GU: No dysuria, frequency or urgency, or incontinence  MUSCULOSKELETAL: No unrelieved bone/joint pain NEUROLOGIC: No headache, dizziness  PSYCHIATRIC: No overt anxiety or sadness  Vitals:   04/02/19 1218  BP: 121/70  Pulse: 88  Resp: 18  Temp: 98.2 F (36.8 C)   Body mass index is 17.71 kg/m. Physical Exam  GENERAL APPEARANCE: Alert, conversant, No acute distress  SKIN: No diaphoresis rash HEENT: Unremarkable RESPIRATORY: Breathing is even, unlabored. Lung sounds are clear   CARDIOVASCULAR: Heart RRR no murmurs, rubs or  gallops. No peripheral edema  GASTROINTESTINAL: Abdomen is soft, non-tender, not distended w/ normal bowel sounds.  GENITOURINARY: Bladder non tender, not distended  MUSCULOSKELETAL: No abnormal joints or musculature NEUROLOGIC: Cranial nerves 2-12 grossly intact. Moves all extremities PSYCHIATRIC: Mood and affect appropriate to situation with dementia, no behavioral issues  Patient Active Problem List   Diagnosis Date Noted  . Mood disorder (HCC) 03/08/2019  . Compression fracture of L1 lumbar vertebra (HCC) 12/21/2018  . Closed fracture of transverse process of thoracic vertebra (HCC) 12/21/2018  . Hypertension 12/21/2018  . Hypothyroidism 12/21/2018  . Depression, major, single episode, in partial remission (HCC) 12/21/2018  . GERD (gastroesophageal reflux disease) 12/21/2018  . Dementia with behavioral disturbance (HCC) 12/21/2018  . Multiple rib fractures 12/21/2018  . Lung contusion 12/21/2018  . Closed fracture dislocation of lumbar spine (HCC) 11/08/2018  . Thoracic spine fracture (HCC) 10/21/2018    CMP     Component Value Date/Time   NA 137 11/19/2018   K 4.2 11/19/2018   BUN 19 11/19/2018   CREATININE 0.7 11/19/2018   Recent Labs    11/19/18  NA 137  K 4.2  BUN 19  CREATININE 0.7   No results for input(s): AST, ALT, ALKPHOS, BILITOT, PROT, ALBUMIN in the last 8760 hours. Recent Labs    11/19/18  WBC 4.7  HGB 7.8*  HCT 23*  PLT 366   No results for input(s): CHOL, LDLCALC, TRIG in the last 8760 hours.  Invalid input(s): HCL No results found for: MICROALBUR No results found for: TSH No results found for: HGBA1C No results found for: CHOL, HDL, LDLCALC, LDLDIRECT, TRIG, CHOLHDL  Significant Diagnostic Results in last 30 days:  No results found.  Assessment and Plan  Hypertension Chronic and stable;And lisinopril 20 mg dailyContinue metoprolol 25 mg twice daily  GERD (gastroesophageal reflux disease) Stable with no reported problems;  continuePepcid 20 mg twice daily  Depression, major, single episode, in partial remission (HCC) Appears controlled; continue Paxil which is been increased to 30 mg daily     Margit HanksAnne D , MD

## 2019-04-05 ENCOUNTER — Encounter: Payer: Self-pay | Admitting: Internal Medicine

## 2019-04-05 NOTE — Assessment & Plan Note (Signed)
Chronic and stable;And lisinopril 20 mg dailyContinue metoprolol 25 mg twice daily

## 2019-04-05 NOTE — Assessment & Plan Note (Signed)
Appears controlled; continue Paxil which is been increased to 30 mg daily

## 2019-04-05 NOTE — Assessment & Plan Note (Signed)
Stable with no reported problems; continuePepcid 20 mg twice daily

## 2019-04-21 ENCOUNTER — Encounter: Payer: Self-pay | Admitting: Internal Medicine

## 2019-04-21 ENCOUNTER — Non-Acute Institutional Stay (SKILLED_NURSING_FACILITY): Payer: Medicare Other | Admitting: Internal Medicine

## 2019-04-21 DIAGNOSIS — Z0000 Encounter for general adult medical examination without abnormal findings: Secondary | ICD-10-CM

## 2019-04-21 NOTE — Progress Notes (Signed)
Subjective:   Alicia Benitez is an 83 y.o. female who presents for Medicare Annual (Subsequent) preventive examination.  Review of Systems:  This is limited secondary to dementia.  In general she is not complaining of any fever chills.  Skin does not complain of rashes or itching.  Head ears eyes nose mouth and throat no complaints of visual changes or sore throat.  Respiratory no complaints of shortness of breath or cough.  Cardiac does not complain of chest pain or edema.  GI does not complain of abdominal discomfort nausea vomiting diarrhea constipation.  GU no complaints of dysuria.  Musculoskeletal denies joint pain.  Neurologic does not complain of dizziness headache or numbness.  And psych does have some history of dementia and mood disorder no recent behaviors noted by nursing staff -- does not complain of overt depression today       Objective:     Vitals: BP 106/74   Pulse 95   Temp 98.6 F (37 C) (Oral)   Resp 18   Ht 5\' 5"  (1.651 m)   Wt 104 lb 3.2 oz (47.3 kg)   BMI 17.34 kg/m   Body mass index is 17.34 kg/m.   General this is a pleasant elderly female in no distress sitting comfortably in her wheelchair.  Her skin is warm and dry.  Eyes visual acuity appears to be intact sclera and conjunctive are clear.  Clear to auscultation there is no labored breathing.  Heart is regular rate and rhythm without murmur gallop or rub she does not have significant lower extremity edema  Abdomen is soft --somewhat protuberant with positive bowel sounds  Musculoskeletal does move all extremities x4 it appears at baseline.  Neurologic her speech is clear cranial nerves appear grossly intact could not appreciate lateralizing findings  Psych she is oriented to self does have somewhat of a tangential train of thought-she is pleasant and appropriate and does follow simple verbal commands   Advanced Directives 04/21/2019 03/05/2019 02/06/2019  Does Patient Have a  Medical Advance Directive? Yes Yes Yes  Type of Advance Directive Out of facility DNR (pink MOST or yellow form) Healthcare Power of [place];Living will;Out of facility DNR (pink MOST or yellow form) Healthcare Power of [place];Living will;Out of facility DNR (pink MOST or yellow form)  Does patient want to make changes to medical advance directive? No - Patient declined No - Patient declined No - Patient declined  Copy of Healthcare Power of Attorney in Chart? No - copy requested Yes - validated most recent copy scanned in chart (See row information) Yes - validated most recent copy scanned in chart (See row information)  Pre-existing out of facility DNR order (yellow form or pink MOST form) - Yellow form placed in chart (order not valid for inpatient use) Yellow form placed in chart (order not valid for inpatient use)    Tobacco Social History   Tobacco Use  Smoking Status Never Smoker  Smokeless Tobacco Never Used     Counseling given: Not Answered   Clinical Intake:  Pre-visit preparation completed: Yes  Pain : No/denies pain     BMI - recorded: 17.34 Nutritional Risks: Other (Comment) Diabetes: No  How often do you need to have someone help you when you read instructions, pamphlets, or other written materials from your doctor or pharmacy?: 5 - Always What is the last grade level you completed in school?: could not tell me  Interpreter Needed?: No  Comments: patient is nursing home resident with some  level of dementia..she requires assistance with ADL's-needs set up for feeding,...per staff she is now eating better...she denies any pain and feels she is treated well. Information entered by :: Edmon CrapeArlo Mirjana Tarleton  PA-C  Past Medical History:  Diagnosis Date  . Anxiety   . Dementia (HCC)   . Hypercholesteremia   . Hypertension   . Osteoporosis   . Thyroid disease    Past Surgical History:  Procedure Laterality Date  . ABDOMINAL HYSTERECTOMY    . APPENDECTOMY    . BREAST  BIOPSY Left   . excision of meckel diverticulum    . IM Nailing Hip fracture    . salpingophorectomy     Family History  Problem Relation Age of Onset  . Hyperlipidemia Mother   . Diabetes Brother   . Cancer Son    Social History   Socioeconomic History  . Marital status: Widowed    Spouse name: Not on file  . Number of children: Not on file  . Years of education: Not on file  . Highest education level: Not on file  Occupational History  . Not on file  Social Needs  . Financial resource strain: Not on file  . Food insecurity    Worry: Not on file    Inability: Not on file  . Transportation needs    Medical: Not on file    Non-medical: Not on file  Tobacco Use  . Smoking status: Never Smoker  . Smokeless tobacco: Never Used  Substance and Sexual Activity  . Alcohol use: Not on file    Comment: No  . Drug use: Not on file    Comment: No  . Sexual activity: Not on file  Lifestyle  . Physical activity    Days per week: Not on file    Minutes per session: Not on file  . Stress: Not on file  Relationships  . Social Musicianconnections    Talks on phone: Not on file    Gets together: Not on file    Attends religious service: Not on file    Active member of club or organization: Not on file    Attends meetings of clubs or organizations: Not on file    Relationship status: Not on file  Other Topics Concern  . Not on file  Social History Narrative  . Not on file    Outpatient Encounter Medications as of 04/21/2019  Medication Sig  . acetaminophen (TYLENOL) 325 MG tablet Take 650 mg by mouth every 8 (eight) hours as needed for mild pain or moderate pain.   Marland Kitchen. aspirin 81 MG chewable tablet Chew 81 mg by mouth daily.  . bisacodyl (DULCOLAX) 10 MG suppository Place 10 mg rectally as needed for moderate constipation.  . Cholecalciferol (VITAMIN D3) 1.25 MG (50000 UT) CAPS Take 1 capsule by mouth once a week.  . divalproex (DEPAKOTE) 125 MG DR tablet Take 125 mg by mouth 2 (two)  times daily.  . famotidine (PEPCID) 20 MG tablet Take 20 mg by mouth 2 (two) times daily.   Marland Kitchen. levothyroxine (SYNTHROID, LEVOTHROID) 88 MCG tablet Take 88 mcg by mouth daily before breakfast.   . lisinopril (PRINIVIL,ZESTRIL) 20 MG tablet Take 20 mg by mouth daily.   . magnesium hydroxide (MILK OF MAGNESIA) 400 MG/5ML suspension Take 30 mLs by mouth daily as needed for mild constipation.  . metoprolol tartrate (LOPRESSOR) 25 MG tablet Take 25 mg by mouth 2 (two) times daily.   . NON FORMULARY Diet Type:  Regular,  Liberalized diet  . Nutritional Supplement LIQD Take 4 oz by mouth 2 (two) times a day. MedPass  . Nutritional Supplements (NUTRITIONAL SUPPLEMENT PO) Take 1 each by mouth 2 (two) times a day. Lunch and dinner  . PARoxetine (PAXIL) 30 MG tablet Take 30 mg by mouth daily.   . potassium chloride (MICRO-K) 10 MEQ CR capsule Take 10 mEq by mouth 2 (two) times daily.    No facility-administered encounter medications on file as of 04/21/2019.     Activities of Daily LivingNo flowsheet data found.  Patient Care Team: Spry, Marsh Dolly., MD as PCP - General (Family Medicine)    Assessment:   This is a routine wellness examination for [place].  Exercise Activities and Dietary recommendations    Goals   None   patient will need encouragement to eat--per nursing is doing better  Weight will need to be watched Nursing does not report any behavorial issues... Patient will need to be encouraged to do as much as she can without compromising safety  Fall Risk No flowsheet data found. Is the patient's home free of loose throw rugs in walkways, pet beds, electrical cords, etc?   yes      Grab bars in the bathroom? yes      Handrails on the stairs?   No stairs      Adequate lighting?   yes    Depression Screen --- She scored 11 out of 27 on mood disorder scale-lower number is more desirable  Cognitive Function     --3 out of 15 on BIMS cognitive function test    Immunization  History  Administered Date(s) Administered  . Influenza-Unspecified 07/26/2018  . Pneumococcal Conjugate-13 11/23/2013  . Pneumococcal Polysaccharide-23 02/23/2017  . Tdap 07/27/2011  . Zoster 11/24/2007    Qualifies for Shingles Vaccine?  Screening Tests Health Maintenance  Topic Date Due  . INFLUENZA VACCINE  04/26/2019  . TETANUS/TDAP  07/26/2021  . PNA vac Low Risk Adult  Completed  . DEXA SCAN  Discontinued    Cancer Screenings: Lung: Low Dose CT Chest recommended if Age 44-80 years, 30 pack-year currently smoki which Breast:  Up to date on Mammogram?   Discontinued Up to date of Bone Density/Dexa? Discontinued   Additional Screenings:  Hepatitis C Screening: ---does not apply     Plan:   At this point continue supportive care and encourage po intake---monitor weights and clinically status---patient with dementia which complicates matters but at this point sees content--  I have personally reviewed and noted the following in the patient's chart:   . Medical and social history . Use of alcohol, tobacco or illicit drugs  . Current medications and supplements . Functional ability and status . Nutritional status . Physical activity . Advanced directives . List of other physicians . Hospitalizations, surgeries, and ER visits in previous 12 months . Vitals . Screenings to include cognitive, depression, and falls . Referrals and appointments  In addition, I have reviewed and discussed with patient certain preventive protocols, quality metrics, and best practice recommendations. A written personalized care plan for preventive services as well as general preventive health recommendations were provided to patient. --  ---Of note above discussion and review was difficult to do secondary to patient's dementia -did discuss this with nursing staff   Granville Lewis, PA-C  04/21/2019

## 2019-04-21 NOTE — Patient Instructions (Signed)
---   At this point patient appears to be stable- clinical status will need to be monitored including weights- she appears to be doing well with supportive care currently nursing does not report behavioral issues. With dementia as well as needs for assistance suspect she will continue to be a long-term care resident At this point continue supportive care-

## 2019-04-22 LAB — BASIC METABOLIC PANEL
BUN: 17 (ref 4–21)
Creatinine: 0.7 (ref 0.5–1.1)
Glucose: 78
Potassium: 3.9 (ref 3.4–5.3)
Sodium: 141 (ref 137–147)

## 2019-04-22 LAB — CBC AND DIFFERENTIAL
HCT: 34 — AB (ref 36–46)
Hemoglobin: 11.4 — AB (ref 12.0–16.0)
Neutrophils Absolute: 2
Platelets: 220 (ref 150–399)
WBC: 5.2

## 2019-04-22 LAB — TSH: TSH: 4.28 (ref 0.41–5.90)

## 2019-04-30 ENCOUNTER — Encounter: Payer: Self-pay | Admitting: Internal Medicine

## 2019-04-30 ENCOUNTER — Non-Acute Institutional Stay (SKILLED_NURSING_FACILITY): Payer: Medicare Other | Admitting: Internal Medicine

## 2019-04-30 DIAGNOSIS — S2241XS Multiple fractures of ribs, right side, sequela: Secondary | ICD-10-CM

## 2019-04-30 DIAGNOSIS — F39 Unspecified mood [affective] disorder: Secondary | ICD-10-CM

## 2019-04-30 DIAGNOSIS — E034 Atrophy of thyroid (acquired): Secondary | ICD-10-CM

## 2019-04-30 DIAGNOSIS — G300 Alzheimer's disease with early onset: Secondary | ICD-10-CM

## 2019-04-30 DIAGNOSIS — F0281 Dementia in other diseases classified elsewhere with behavioral disturbance: Secondary | ICD-10-CM

## 2019-04-30 DIAGNOSIS — I10 Essential (primary) hypertension: Secondary | ICD-10-CM

## 2019-04-30 NOTE — Progress Notes (Signed)
[facility] Room Number: 204-W Place of Service:  SNF [phone]) Provider:  Edmon CrapeLassen, Riah Kehoe, PA-C  Margit HanksAlexander, Anne D, MD  Patient Care Team: Margit HanksAlexander, Anne D, MD as PCP - General (Internal Medicine) Synetta ShadowLassen, Jobany Montellano C, PA-C as Physician Assistant (Internal Medicine)  Extended Emergency Contact Information Primary Emergency Contact: Providence Crosbyollins,Kay  [place] Home Phone: [phone] Mobile Phone: [phone] Relation: Daughter  Code Status:  DNR Goals of care: Advanced Directive information Advanced Directives 04/21/2019  Does Patient Have a Medical Advance Directive? Yes  Type of Advance Directive Out of facility DNR (pink MOST or yellow form)  Does patient want to make changes to medical advance directive? No - Patient declined  Copy of Healthcare Power of Attorney in Chart? No - copy requested  Pre-existing out of facility DNR order (yellow form or pink MOST form) -     Chief Complaint  Patient presents with  . Medical Management of Chronic Issues    Routine [facility] visit   Medical management of chronic medical conditions including dementia-GERD-depression-hypothyroidism- history of thoracic spine fracture- apparent hypokalemia-anemia HPI:  Pt is an 83 y.o. female seen today for medical management of chronic diseases.  As noted above.  Patient does not have any acute complaints today nursing staff has not noted any either.  Currently she is resting in bed comfortably.  She does have a history of dementia and continues with supportive care not on any medications she is on med Pass and Magic cup for dietary supplementation appears she lost some weight earlier this year but appears to have stabilized over the past month or so.  Regards to mood disorder she is on Depakote 125 mg twice daily there have been no recent behaviors to my knowledge.  She does have a history of hypertension continues on lisinopril 20 mg a day and  Lopressor 25 mg twice daily- baseline systolic appears to be in the 102V130s on average diastolic often in the 70s.  She does have a pulse in the 50s but is asymptomatic again she is on Lopressor.  Regards to GERD she continues on Pepcid and for depression she is on Paxil and this appears to be stable.  She does have a history of hypothyroidism and is on Synthroid --TSH 4.28 on lab done on July 28 which is just minimally elevated we update lab on July 28  She also has a history of rib fractures but appears to have healed unremarkably without complaints of significant pain- she also has a history of left thoracic spine fracture which has been followed by orthopedics and thought to be stable to improving.  I do note on lab in February her hemoglobin was 7.8 suspect there is some chronicity to this--in fact updated labs on July 28 showed improvement at 11.4   Past Medical History:  Diagnosis Date  . Anxiety   . Dementia (HCC)   . Hypercholesteremia   . Hypertension   . Osteoporosis   . Thyroid disease    Past Surgical History:  Procedure Laterality Date  . ABDOMINAL HYSTERECTOMY    . APPENDECTOMY    . BREAST BIOPSY Left   . excision of meckel diverticulum    . IM Nailing Hip fracture    . salpingophorectomy      Allergies  Allergen Reactions  . Phenytoin   . Yellow Jacket Venom [Bee Venom]     Outpatient Encounter Medications as of 04/30/2019  Medication Sig  . acetaminophen (  TYLENOL) 325 MG tablet Take 650 mg by mouth every 8 (eight) hours as needed for mild pain or moderate pain.   . Ascorbic Acid (VITAMIN C) 1000 MG tablet Take 1,000 mg by mouth daily. x14 days - ending 05/08/19  . aspirin 81 MG chewable tablet Chew 81 mg by mouth daily.  . bisacodyl (DULCOLAX) 10 MG suppository Place 10 mg rectally as needed for moderate constipation.  . Cholecalciferol (VITAMIN D3) 1.25 MG (50000 UT) CAPS Take 1 capsule by mouth once a week.  . divalproex (DEPAKOTE) 125 MG DR tablet Take 125  mg by mouth 2 (two) times daily.  . famotidine (PEPCID) 20 MG tablet Take 20 mg by mouth 2 (two) times daily.   Marland Kitchen levothyroxine (SYNTHROID, LEVOTHROID) 88 MCG tablet Take 88 mcg by mouth daily before breakfast.   . lisinopril (PRINIVIL,ZESTRIL) 20 MG tablet Take 20 mg by mouth daily.   . magnesium hydroxide (MILK OF MAGNESIA) 400 MG/5ML suspension Take 30 mLs by mouth daily as needed for mild constipation.  . metoprolol tartrate (LOPRESSOR) 25 MG tablet Take 25 mg by mouth 2 (two) times daily.   . NON FORMULARY Diet Type:  Regular, Liberalized diet  . Nutritional Supplement LIQD Take 4 oz by mouth 2 (two) times a day. MedPass  . Nutritional Supplements (NUTRITIONAL SUPPLEMENT PO) Take 1 each by mouth 2 (two) times a day. Magic Cup lunch and dinner  . PARoxetine (PAXIL) 30 MG tablet Take 30 mg by mouth daily.   . potassium chloride (MICRO-K) 10 MEQ CR capsule Take 10 mEq by mouth 2 (two) times daily.    No facility-administered encounter medications on file as of 04/30/2019.     Review of Systems  This is somewhat limited since patient is a poor historian   in general no complaints of fever chills  Skin does not complain of rashes or itching   head ears eyes nose mouth and throat is not complaining of visual changes or sore throat  Respiratory does not complain of shortness of breath or cough  Cardiac is not complaining of chest pain edema or palpitations  GI is not complaining of abdominal discomfort nausea vomiting or diarrhea or constipation   GU no complaints of dysuria.  Muscle- skeletal does have lower extremity weakness but is not complaining of joint pain  Neurologic does not complain of dizziness headache or syncope does have weakness  Psych does have evidence of dementia does not appear to be overtly depressed or anxious  Immunization History  Administered Date(s) Administered  . Influenza-Unspecified 07/26/2018  . Pneumococcal Conjugate-13 11/23/2013  . Pneumococcal  Polysaccharide-23 02/23/2017  . Tdap 07/27/2011  . Zoster 11/24/2007   Pertinent  Health Maintenance Due  Topic Date Due  . INFLUENZA VACCINE  04/26/2019  . PNA vac Low Risk Adult  Completed  . DEXA SCAN  Discontinued   No flowsheet data found. Functional Status Survey:    Vitals:   04/30/19 0811  BP: 121/71  Pulse: 78  Resp: 19  Temp: 97.8 F (36.6 C)  TempSrc: Oral  Weight: 104 lb 3.2 oz (47.3 kg)  Height: 5\' 5"  (1.651 m)   Body mass index is 17.34 kg/m. Physical Exam   In general this is a pleasant elderly female in no distress lying comfortably in bed  Her skin is warm and dry  Eyes visual acuity appears to be intact sclera and conjunctive are clear  Oropharynx is clear mucous membranes moist  Chest is clear to auscultation with somewhat  shallow air entry there is no labored breathing  Heart is regular rate and rhythm without murmur gallop or rub she does not have significant lower extremity edema  Abdomen is soft nontender with positive bowel sounds   Musculoskeletal is able to move all extremities x4 with baseline lower extremity weakness  Neurologic cannot appreciate any lateralizing findings cranial nerves appear to be intact her speech is clear   Psych she is oriented to self she does follow simple verbal commands she is pleasant appropriate  Labs reviewed:  April 22, 2019.  WBC 5.2 hemoglobin 11.4 platelets 220  sodium 141 potassium 3.9 BUN 17.2 creatinine 0.65  TSH 4.28   Recent Labs    11/19/18  NA 137  K 4.2  BUN 19  CREATININE 0.7   No results for input(s): AST, ALT, ALKPHOS, BILITOT, PROT, ALBUMIN in the last 8760 hours. Recent Labs    11/19/18  WBC 4.7  HGB 7.8*  HCT 23*  PLT 366   No results found for: TSH No results found for: HGBA1C No results found for: CHOL, HDL, LDLCALC, LDLDIRECT, TRIG, CHOLHDL  Significant Diagnostic Results in last 30 days:  No results found.  Assessment/Plan  1  Dementia-she appears to be  doing fairly well with supportive care-she is on med Pass and Magic cup for supplementation due to some recent weight loss appears to have stabilized  Continue supportive care no behaviors have been noted.  She does to continue on Depakote for a history of mood disorder.  2-hypertension as noted above this appears to be fairly stable on lisinopril 20 mg a day as well as Lopressor 25 mg twice daily.  3 hypothyroidism she is on Synthroid 88 mcg a day TSH last week was 4.28  4 history of thoracic spine fractures as well as rib fractures she appears to be comfortable-she has seen orthopedics and it was thought that thoracic spine fractures are healing  #5 history of depression she continues on Paxil 30 mg a day.  6 history of GERD she is on Pepcid 20 mg twice daily.  7 history of anemia.  Hgb on lab done last week was 11.4 which shows significant improvement from 7.8 level earlier this year  8.  History of hypokalemia she is on supplementation 10 M EQ's twice daily--- potassium was 3.9 on lab done last week  [id number]

## 2019-06-03 ENCOUNTER — Non-Acute Institutional Stay (SKILLED_NURSING_FACILITY): Payer: Medicare Other | Admitting: Internal Medicine

## 2019-06-03 ENCOUNTER — Encounter: Payer: Self-pay | Admitting: Internal Medicine

## 2019-06-03 DIAGNOSIS — F0281 Dementia in other diseases classified elsewhere with behavioral disturbance: Secondary | ICD-10-CM | POA: Diagnosis not present

## 2019-06-03 DIAGNOSIS — F39 Unspecified mood [affective] disorder: Secondary | ICD-10-CM | POA: Diagnosis not present

## 2019-06-03 DIAGNOSIS — E034 Atrophy of thyroid (acquired): Secondary | ICD-10-CM | POA: Diagnosis not present

## 2019-06-03 DIAGNOSIS — G300 Alzheimer's disease with early onset: Secondary | ICD-10-CM

## 2019-06-03 NOTE — Progress Notes (Signed)
Location:  [facility] Room Number: 204-W Place of Service:  SNF (31)  Hennie Duos, MD  Patient Care Team: Hennie Duos, MD as PCP - General (Internal Medicine) Rolm Baptise as Physician Assistant (Internal Medicine)  Extended Emergency Contact Information Primary Emergency Contact: Vonna Kotyk [place] Phone: [phone] Mobile Phone: [phone] Relation: Daughter    Allergies: Phenytoin and Yellow jacket venom [bee venom]  Chief Complaint  Patient presents with  . Medical Management of Chronic Issues    Routine [facility] visit    HPI: Patient is an 83 y.o. female who is being seen for routine issues of hypothyroidism, dementia, and mood disorder.  Past Medical History:  Diagnosis Date  . Anxiety   . Dementia ([place])   . Hypercholesteremia   . Hypertension   . Osteoporosis   . Thyroid disease     Past Surgical History:  Procedure Laterality Date  . ABDOMINAL HYSTERECTOMY    . APPENDECTOMY    . BREAST BIOPSY Left   . excision of meckel diverticulum    . IM Nailing Hip fracture    . salpingophorectomy      Allergies as of 06/03/2019      Reactions   Phenytoin    Yellow Jacket Venom [bee Venom]       Medication List       Accurate as of June 03, 2019 11:59 PM. If you have any questions, ask your nurse or doctor.        acetaminophen 325 MG tablet Commonly known as: TYLENOL Take 650 mg by mouth every 8 (eight) hours as needed for mild pain or moderate pain.   aspirin 81 MG chewable tablet Chew 81 mg by mouth daily.   bisacodyl 10 MG suppository Commonly known as: DULCOLAX Place 10 mg rectally as needed for moderate constipation.   divalproex 125 MG DR tablet Commonly known as: DEPAKOTE Take 125 mg by mouth 2 (two) times daily.   famotidine 20 MG tablet Commonly known as: PEPCID Take 20 mg by mouth 2 (two) times daily.   levothyroxine 88 MCG tablet Commonly  known as: SYNTHROID Take 88 mcg by mouth daily before breakfast.   lisinopril 20 MG tablet Commonly known as: ZESTRIL Take 20 mg by mouth daily.   magnesium hydroxide 400 MG/5ML suspension Commonly known as: MILK OF MAGNESIA Take 30 mLs by mouth daily as needed for mild constipation.   metoprolol tartrate 25 MG tablet Commonly known as: LOPRESSOR Take 25 mg by mouth 2 (two) times daily.   NON FORMULARY Diet Type:  Regular, Liberalized diet   Nutritional Supplement Liqd Take 4 oz by mouth 3 (three) times daily. MedPass   NUTRITIONAL SUPPLEMENT PO Take 1 each by mouth 2 (two) times a day. Magic Cup lunch and dinner   PARoxetine 20 MG tablet Commonly known as: PAXIL Take 20 mg by mouth daily. What changed: Another medication with the same name was removed. Continue taking this medication, and follow the directions you see here. Changed by: Inocencio Homes, MD   potassium chloride 10 MEQ CR capsule Commonly known as: MICRO-K Take 10 mEq by mouth 2 (two) times daily.   Vitamin D3 1.25 MG (50000 UT) Caps Take 1 capsule by mouth once a week.       No orders of the defined types were placed in this encounter.   Immunization History  Administered Date(s) Administered  . Influenza-Unspecified 07/26/2018  . Pneumococcal  Conjugate-13 11/23/2013  . Pneumococcal Polysaccharide-23 02/23/2017  . Tdap 07/27/2011  . Zoster 11/24/2007    Social History   Tobacco Use  . Smoking status: Never Smoker  . Smokeless tobacco: Never Used  Substance Use Topics  . Alcohol use: Not on file    Comment: No    Review of Systems  DATA OBTAINED: from patient, nurse GENERAL:  no fevers, fatigue, appetite changes SKIN: No itching, rash HEENT: No complaint RESPIRATORY: No cough, wheezing, SOB CARDIAC: No chest pain, palpitations, lower extremity edema  GI: No abdominal pain, No N/V/D or constipation, No heartburn or reflux  GU: No dysuria, frequency or urgency, or incontinence   MUSCULOSKELETAL: No unrelieved bone/joint pain NEUROLOGIC: No headache, dizziness  PSYCHIATRIC: No overt anxiety or sadness  Vitals:   06/03/19 1151  BP: 132/83  Pulse: 76  Resp: 19  Temp: 97.6 F (36.4 C)   Body mass index is 17.47 kg/m. Physical Exam  GENERAL APPEARANCE: Alert, conversant, No acute distress  SKIN: No diaphoresis rash HEENT: Unremarkable RESPIRATORY: Breathing is even, unlabored. Lung sounds are clear   CARDIOVASCULAR: Heart RRR no murmurs, rubs or gallops. No peripheral edema  GASTROINTESTINAL: Abdomen is soft, non-tender, not distended w/ normal bowel sounds.  GENITOURINARY: Bladder non tender, not distended  MUSCULOSKELETAL: No abnormal joints or musculature NEUROLOGIC: Cranial nerves 2-12 grossly intact. Moves all extremities PSYCHIATRIC: Mood and affect appropriate to situation with dementia, no behavioral issues  Patient Active Problem List   Diagnosis Date Noted  . Mood disorder (HCC) 03/08/2019  . Compression fracture of L1 lumbar vertebra (HCC) 12/21/2018  . Closed fracture of transverse process of thoracic vertebra (HCC) 12/21/2018  . Hypertension 12/21/2018  . Hypothyroidism 12/21/2018  . Depression, major, single episode, in partial remission (HCC) 12/21/2018  . GERD (gastroesophageal reflux disease) 12/21/2018  . Dementia with behavioral disturbance (HCC) 12/21/2018  . Multiple rib fractures 12/21/2018  . Lung contusion 12/21/2018  . Closed fracture dislocation of lumbar spine (HCC) 11/08/2018  . Thoracic spine fracture (HCC) 10/21/2018    CMP     Component Value Date/Time   NA 141 04/22/2019   K 3.9 04/22/2019   BUN 17 04/22/2019   CREATININE 0.7 04/22/2019   Recent Labs    11/19/18 04/22/19  NA 137 141  K 4.2 3.9  BUN 19 17  CREATININE 0.7 0.7   No results for input(s): AST, ALT, ALKPHOS, BILITOT, PROT, ALBUMIN in the last 8760 hours. Recent Labs    11/19/18 04/22/19  WBC 4.7 5.2  NEUTROABS  --  2  HGB 7.8* 11.4*  HCT  23* 34*  PLT 366 220   No results for input(s): CHOL, LDLCALC, TRIG in the last 8760 hours.  Invalid input(s): HCL No results found for: MICROALBUR Lab Results  Component Value Date   TSH 4.28 04/22/2019   No results found for: HGBA1C No results found for: CHOL, HDL, LDLCALC, LDLDIRECT, TRIG, CHOLHDL  Significant Diagnostic Results in last 30 days:  No results found.  Assessment and Plan  Hypothyroidism TSH 4.29, controlled; continue Synthroid 88 mcg daily    Dementia with behavioral disturbance (HCC) Stable on no meds; continue supportive care  Mood disorder (HCC) Stable; continue Depakote 125 mg twice daily    Margit HanksAnne D Mishael Krysiak, MD

## 2019-06-07 ENCOUNTER — Encounter: Payer: Self-pay | Admitting: Internal Medicine

## 2019-06-07 NOTE — Assessment & Plan Note (Signed)
Stable; on no meds; continue supportive care 

## 2019-06-07 NOTE — Assessment & Plan Note (Signed)
Stable; continue Depakote 125 mg twice daily

## 2019-06-07 NOTE — Assessment & Plan Note (Signed)
TSH 4.29, controlled; continue Synthroid 88 mcg daily

## 2019-07-09 ENCOUNTER — Other Ambulatory Visit: Payer: Self-pay

## 2019-07-09 ENCOUNTER — Observation Stay (HOSPITAL_COMMUNITY): Payer: Medicare Other

## 2019-07-09 ENCOUNTER — Inpatient Hospital Stay (HOSPITAL_COMMUNITY)
Admission: EM | Admit: 2019-07-09 | Discharge: 2019-07-27 | DRG: 388 | Disposition: E | Payer: Medicare Other | Source: Skilled Nursing Facility | Attending: Internal Medicine | Admitting: Internal Medicine

## 2019-07-09 ENCOUNTER — Emergency Department (HOSPITAL_COMMUNITY): Payer: Medicare Other

## 2019-07-09 ENCOUNTER — Encounter (HOSPITAL_COMMUNITY): Payer: Self-pay

## 2019-07-09 DIAGNOSIS — E034 Atrophy of thyroid (acquired): Secondary | ICD-10-CM

## 2019-07-09 DIAGNOSIS — R140 Abdominal distension (gaseous): Secondary | ICD-10-CM

## 2019-07-09 DIAGNOSIS — Z7982 Long term (current) use of aspirin: Secondary | ICD-10-CM

## 2019-07-09 DIAGNOSIS — N390 Urinary tract infection, site not specified: Secondary | ICD-10-CM | POA: Diagnosis present

## 2019-07-09 DIAGNOSIS — Z79890 Hormone replacement therapy (postmenopausal): Secondary | ICD-10-CM

## 2019-07-09 DIAGNOSIS — K219 Gastro-esophageal reflux disease without esophagitis: Secondary | ICD-10-CM | POA: Diagnosis present

## 2019-07-09 DIAGNOSIS — B9620 Unspecified Escherichia coli [E. coli] as the cause of diseases classified elsewhere: Secondary | ICD-10-CM | POA: Diagnosis present

## 2019-07-09 DIAGNOSIS — L89222 Pressure ulcer of left hip, stage 2: Secondary | ICD-10-CM | POA: Diagnosis present

## 2019-07-09 DIAGNOSIS — E039 Hypothyroidism, unspecified: Secondary | ICD-10-CM | POA: Diagnosis present

## 2019-07-09 DIAGNOSIS — K567 Ileus, unspecified: Principal | ICD-10-CM

## 2019-07-09 DIAGNOSIS — Z681 Body mass index (BMI) 19 or less, adult: Secondary | ICD-10-CM

## 2019-07-09 DIAGNOSIS — F324 Major depressive disorder, single episode, in partial remission: Secondary | ICD-10-CM | POA: Diagnosis not present

## 2019-07-09 DIAGNOSIS — R112 Nausea with vomiting, unspecified: Secondary | ICD-10-CM

## 2019-07-09 DIAGNOSIS — Z90710 Acquired absence of both cervix and uterus: Secondary | ICD-10-CM

## 2019-07-09 DIAGNOSIS — K5900 Constipation, unspecified: Secondary | ICD-10-CM

## 2019-07-09 DIAGNOSIS — G300 Alzheimer's disease with early onset: Secondary | ICD-10-CM

## 2019-07-09 DIAGNOSIS — I10 Essential (primary) hypertension: Secondary | ICD-10-CM | POA: Diagnosis not present

## 2019-07-09 DIAGNOSIS — Z79899 Other long term (current) drug therapy: Secondary | ICD-10-CM

## 2019-07-09 DIAGNOSIS — L89226 Pressure-induced deep tissue damage of left hip: Secondary | ICD-10-CM | POA: Diagnosis present

## 2019-07-09 DIAGNOSIS — I959 Hypotension, unspecified: Secondary | ICD-10-CM | POA: Diagnosis present

## 2019-07-09 DIAGNOSIS — Z833 Family history of diabetes mellitus: Secondary | ICD-10-CM

## 2019-07-09 DIAGNOSIS — Z01818 Encounter for other preprocedural examination: Secondary | ICD-10-CM

## 2019-07-09 DIAGNOSIS — F0391 Unspecified dementia with behavioral disturbance: Secondary | ICD-10-CM | POA: Diagnosis present

## 2019-07-09 DIAGNOSIS — K5939 Other megacolon: Secondary | ICD-10-CM | POA: Diagnosis present

## 2019-07-09 DIAGNOSIS — Z91030 Bee allergy status: Secondary | ICD-10-CM

## 2019-07-09 DIAGNOSIS — E876 Hypokalemia: Secondary | ICD-10-CM | POA: Diagnosis not present

## 2019-07-09 DIAGNOSIS — Z515 Encounter for palliative care: Secondary | ICD-10-CM | POA: Diagnosis not present

## 2019-07-09 DIAGNOSIS — E46 Unspecified protein-calorie malnutrition: Secondary | ICD-10-CM | POA: Diagnosis present

## 2019-07-09 DIAGNOSIS — R739 Hyperglycemia, unspecified: Secondary | ICD-10-CM | POA: Diagnosis not present

## 2019-07-09 DIAGNOSIS — F0281 Dementia in other diseases classified elsewhere with behavioral disturbance: Secondary | ICD-10-CM | POA: Diagnosis present

## 2019-07-09 DIAGNOSIS — Z66 Do not resuscitate: Secondary | ICD-10-CM | POA: Diagnosis not present

## 2019-07-09 DIAGNOSIS — Z8349 Family history of other endocrine, nutritional and metabolic diseases: Secondary | ICD-10-CM

## 2019-07-09 DIAGNOSIS — I472 Ventricular tachycardia: Secondary | ICD-10-CM | POA: Diagnosis present

## 2019-07-09 DIAGNOSIS — K5641 Fecal impaction: Secondary | ICD-10-CM | POA: Diagnosis present

## 2019-07-09 DIAGNOSIS — J9601 Acute respiratory failure with hypoxia: Secondary | ICD-10-CM | POA: Diagnosis not present

## 2019-07-09 DIAGNOSIS — I469 Cardiac arrest, cause unspecified: Secondary | ICD-10-CM | POA: Diagnosis not present

## 2019-07-09 DIAGNOSIS — Z978 Presence of other specified devices: Secondary | ICD-10-CM

## 2019-07-09 DIAGNOSIS — E860 Dehydration: Secondary | ICD-10-CM | POA: Diagnosis present

## 2019-07-09 DIAGNOSIS — R197 Diarrhea, unspecified: Secondary | ICD-10-CM

## 2019-07-09 DIAGNOSIS — L8990 Pressure ulcer of unspecified site, unspecified stage: Secondary | ICD-10-CM | POA: Insufficient documentation

## 2019-07-09 DIAGNOSIS — M810 Age-related osteoporosis without current pathological fracture: Secondary | ICD-10-CM | POA: Diagnosis present

## 2019-07-09 DIAGNOSIS — E872 Acidosis: Secondary | ICD-10-CM | POA: Diagnosis not present

## 2019-07-09 DIAGNOSIS — D649 Anemia, unspecified: Secondary | ICD-10-CM | POA: Diagnosis not present

## 2019-07-09 DIAGNOSIS — F419 Anxiety disorder, unspecified: Secondary | ICD-10-CM | POA: Diagnosis present

## 2019-07-09 DIAGNOSIS — Z888 Allergy status to other drugs, medicaments and biological substances status: Secondary | ICD-10-CM

## 2019-07-09 DIAGNOSIS — N179 Acute kidney failure, unspecified: Secondary | ICD-10-CM

## 2019-07-09 DIAGNOSIS — L89320 Pressure ulcer of left buttock, unstageable: Secondary | ICD-10-CM | POA: Diagnosis present

## 2019-07-09 DIAGNOSIS — Z20828 Contact with and (suspected) exposure to other viral communicable diseases: Secondary | ICD-10-CM | POA: Diagnosis present

## 2019-07-09 DIAGNOSIS — E7800 Pure hypercholesterolemia, unspecified: Secondary | ICD-10-CM | POA: Diagnosis present

## 2019-07-09 DIAGNOSIS — R64 Cachexia: Secondary | ICD-10-CM | POA: Diagnosis present

## 2019-07-09 DIAGNOSIS — D72829 Elevated white blood cell count, unspecified: Secondary | ICD-10-CM

## 2019-07-09 DIAGNOSIS — E875 Hyperkalemia: Secondary | ICD-10-CM

## 2019-07-09 DIAGNOSIS — F03918 Unspecified dementia, unspecified severity, with other behavioral disturbance: Secondary | ICD-10-CM | POA: Diagnosis present

## 2019-07-09 LAB — URINALYSIS, ROUTINE W REFLEX MICROSCOPIC
Glucose, UA: NEGATIVE mg/dL
Hgb urine dipstick: NEGATIVE
Ketones, ur: 5 mg/dL — AB
Leukocytes,Ua: NEGATIVE
Nitrite: NEGATIVE
Protein, ur: NEGATIVE mg/dL
Specific Gravity, Urine: 1.02 (ref 1.005–1.030)
pH: 5 (ref 5.0–8.0)

## 2019-07-09 LAB — SARS CORONAVIRUS 2 (TAT 6-24 HRS): SARS Coronavirus 2: NEGATIVE

## 2019-07-09 LAB — CBC WITH DIFFERENTIAL/PLATELET
Abs Immature Granulocytes: 0 10*3/uL (ref 0.00–0.07)
Band Neutrophils: 25 %
Basophils Absolute: 0 10*3/uL (ref 0.0–0.1)
Basophils Relative: 0 %
Eosinophils Absolute: 0 10*3/uL (ref 0.0–0.5)
Eosinophils Relative: 0 %
HCT: 37.5 % (ref 36.0–46.0)
Hemoglobin: 11.7 g/dL — ABNORMAL LOW (ref 12.0–15.0)
Lymphocytes Relative: 16 %
Lymphs Abs: 2 10*3/uL (ref 0.7–4.0)
MCH: 30 pg (ref 26.0–34.0)
MCHC: 31.2 g/dL (ref 30.0–36.0)
MCV: 96.2 fL (ref 80.0–100.0)
Monocytes Absolute: 1.3 10*3/uL — ABNORMAL HIGH (ref 0.1–1.0)
Monocytes Relative: 10 %
Neutro Abs: 9.4 10*3/uL — ABNORMAL HIGH (ref 1.7–7.7)
Neutrophils Relative %: 49 %
Platelets: 258 10*3/uL (ref 150–400)
RBC: 3.9 MIL/uL (ref 3.87–5.11)
RDW: 15.5 % (ref 11.5–15.5)
WBC: 12.7 10*3/uL — ABNORMAL HIGH (ref 4.0–10.5)
nRBC: 0 % (ref 0.0–0.2)

## 2019-07-09 LAB — COMPREHENSIVE METABOLIC PANEL
ALT: 6 U/L (ref 0–44)
AST: 14 U/L — ABNORMAL LOW (ref 15–41)
Albumin: 2.6 g/dL — ABNORMAL LOW (ref 3.5–5.0)
Alkaline Phosphatase: 63 U/L (ref 38–126)
Anion gap: 11 (ref 5–15)
BUN: 74 mg/dL — ABNORMAL HIGH (ref 8–23)
CO2: 19 mmol/L — ABNORMAL LOW (ref 22–32)
Calcium: 8.3 mg/dL — ABNORMAL LOW (ref 8.9–10.3)
Chloride: 106 mmol/L (ref 98–111)
Creatinine, Ser: 1.77 mg/dL — ABNORMAL HIGH (ref 0.44–1.00)
GFR calc Af Amer: 31 mL/min — ABNORMAL LOW (ref >60)
GFR calc non Af Amer: 26 mL/min — ABNORMAL LOW (ref >60)
Glucose, Bld: 91 mg/dL (ref 70–99)
Potassium: 6.5 mmol/L (ref 3.5–5.1)
Sodium: 136 mmol/L (ref 135–145)
Total Bilirubin: 0.5 mg/dL (ref 0.3–1.2)
Total Protein: 5.5 g/dL — ABNORMAL LOW (ref 6.5–8.1)

## 2019-07-09 LAB — CBG MONITORING, ED
Glucose-Capillary: 158 mg/dL — ABNORMAL HIGH (ref 70–99)
Glucose-Capillary: 59 mg/dL — ABNORMAL LOW (ref 70–99)

## 2019-07-09 LAB — BASIC METABOLIC PANEL
Anion gap: 8 (ref 5–15)
BUN: 67 mg/dL — ABNORMAL HIGH (ref 8–23)
CO2: 22 mmol/L (ref 22–32)
Calcium: 7.6 mg/dL — ABNORMAL LOW (ref 8.9–10.3)
Chloride: 110 mmol/L (ref 98–111)
Creatinine, Ser: 1.59 mg/dL — ABNORMAL HIGH (ref 0.44–1.00)
GFR calc Af Amer: 35 mL/min — ABNORMAL LOW (ref >60)
GFR calc non Af Amer: 30 mL/min — ABNORMAL LOW (ref >60)
Glucose, Bld: 61 mg/dL — ABNORMAL LOW (ref 70–99)
Potassium: 4.5 mmol/L (ref 3.5–5.1)
Sodium: 140 mmol/L (ref 135–145)

## 2019-07-09 LAB — T4, FREE: Free T4: 1.13 ng/dL — ABNORMAL HIGH (ref 0.61–1.12)

## 2019-07-09 LAB — GLUCOSE, CAPILLARY: Glucose-Capillary: 75 mg/dL (ref 70–99)

## 2019-07-09 LAB — TSH: TSH: 5.57 u[IU]/mL — ABNORMAL HIGH (ref 0.350–4.500)

## 2019-07-09 LAB — MRSA PCR SCREENING: MRSA by PCR: NEGATIVE

## 2019-07-09 MED ORDER — SODIUM CHLORIDE 0.9 % IV SOLN
INTRAVENOUS | Status: DC
  Administered 2019-07-09: 11:00:00 via INTRAVENOUS

## 2019-07-09 MED ORDER — SODIUM CHLORIDE 0.9 % IV BOLUS
1000.0000 mL | Freq: Once | INTRAVENOUS | Status: AC
  Administered 2019-07-09: 1000 mL via INTRAVENOUS

## 2019-07-09 MED ORDER — SODIUM CHLORIDE 0.9 % IV BOLUS
1000.0000 mL | Freq: Once | INTRAVENOUS | Status: AC
  Administered 2019-07-09: 09:00:00 1000 mL via INTRAVENOUS

## 2019-07-09 MED ORDER — HEPARIN SODIUM (PORCINE) 5000 UNIT/ML IJ SOLN
5000.0000 [IU] | Freq: Three times a day (TID) | INTRAMUSCULAR | Status: DC
  Administered 2019-07-09 – 2019-07-16 (×21): 5000 [IU] via SUBCUTANEOUS
  Filled 2019-07-09 (×21): qty 1

## 2019-07-09 MED ORDER — ENSURE ENLIVE PO LIQD
237.0000 mL | Freq: Two times a day (BID) | ORAL | Status: DC
  Administered 2019-07-10 – 2019-07-18 (×12): 237 mL via ORAL

## 2019-07-09 MED ORDER — SODIUM CHLORIDE 0.9 % IV BOLUS: 1000.0000 mL | Freq: Once | INTRAVENOUS | Status: DC

## 2019-07-09 MED ORDER — PRO-STAT SUGAR FREE PO LIQD
30.0000 mL | Freq: Two times a day (BID) | ORAL | Status: DC
  Administered 2019-07-09 – 2019-07-18 (×14): 30 mL via ORAL
  Filled 2019-07-09 (×17): qty 30

## 2019-07-09 MED ORDER — LEVOTHYROXINE SODIUM 88 MCG PO TABS
88.0000 ug | ORAL_TABLET | Freq: Every day | ORAL | Status: DC
  Administered 2019-07-09 – 2019-07-19 (×11): 88 ug via ORAL
  Filled 2019-07-09 (×11): qty 1

## 2019-07-09 MED ORDER — DEXTROSE 50 % IV SOLN
1.0000 | Freq: Once | INTRAVENOUS | Status: AC
  Administered 2019-07-09: 08:00:00 50 mL via INTRAVENOUS
  Filled 2019-07-09: qty 50

## 2019-07-09 MED ORDER — ONDANSETRON HCL 4 MG PO TABS: 4.0000 mg | ORAL_TABLET | Freq: Four times a day (QID) | ORAL | Status: DC | PRN

## 2019-07-09 MED ORDER — FAMOTIDINE 20 MG PO TABS
20.0000 mg | ORAL_TABLET | Freq: Two times a day (BID) | ORAL | Status: DC
  Administered 2019-07-09: 11:00:00 20 mg via ORAL
  Filled 2019-07-09: qty 1

## 2019-07-09 MED ORDER — ASPIRIN 81 MG PO CHEW
81.0000 mg | CHEWABLE_TABLET | Freq: Every day | ORAL | Status: DC
  Administered 2019-07-09 – 2019-07-15 (×7): 81 mg via ORAL
  Filled 2019-07-09 (×7): qty 1

## 2019-07-09 MED ORDER — ADULT MULTIVITAMIN W/MINERALS CH
1.0000 | ORAL_TABLET | Freq: Every day | ORAL | Status: DC
  Administered 2019-07-10 – 2019-07-18 (×9): 1 via ORAL
  Filled 2019-07-09 (×9): qty 1

## 2019-07-09 MED ORDER — ACETAMINOPHEN 325 MG PO TABS
650.0000 mg | ORAL_TABLET | Freq: Four times a day (QID) | ORAL | Status: DC | PRN
  Administered 2019-07-14 – 2019-07-15 (×2): 650 mg via ORAL
  Filled 2019-07-09 (×2): qty 2

## 2019-07-09 MED ORDER — IOHEXOL 300 MG/ML  SOLN: 30.0000 mL | Freq: Once | INTRAMUSCULAR | Status: DC | PRN

## 2019-07-09 MED ORDER — ACETAMINOPHEN 650 MG RE SUPP
650.0000 mg | Freq: Four times a day (QID) | RECTAL | Status: DC | PRN
  Administered 2019-07-11: 650 mg via RECTAL
  Filled 2019-07-09: qty 1

## 2019-07-09 MED ORDER — ORAL CARE MOUTH RINSE
15.0000 mL | Freq: Two times a day (BID) | OROMUCOSAL | Status: DC
  Administered 2019-07-09 – 2019-07-19 (×21): 15 mL via OROMUCOSAL

## 2019-07-09 MED ORDER — DEXTROSE-NACL 5-0.45 % IV SOLN
INTRAVENOUS | Status: DC
  Administered 2019-07-09 – 2019-07-15 (×8): via INTRAVENOUS

## 2019-07-09 MED ORDER — SENNOSIDES-DOCUSATE SODIUM 8.6-50 MG PO TABS
1.0000 | ORAL_TABLET | Freq: Two times a day (BID) | ORAL | Status: DC
  Administered 2019-07-09 – 2019-07-15 (×14): 1 via ORAL
  Filled 2019-07-09 (×14): qty 1

## 2019-07-09 MED ORDER — ALBUTEROL SULFATE (2.5 MG/3ML) 0.083% IN NEBU: 2.5000 mg | INHALATION_SOLUTION | RESPIRATORY_TRACT | Status: DC | PRN

## 2019-07-09 MED ORDER — HYDROCODONE-ACETAMINOPHEN 5-325 MG PO TABS
1.0000 | ORAL_TABLET | ORAL | Status: DC | PRN
  Filled 2019-07-09: qty 1

## 2019-07-09 MED ORDER — FAMOTIDINE 20 MG PO TABS
10.0000 mg | ORAL_TABLET | Freq: Every day | ORAL | Status: DC
  Administered 2019-07-10 – 2019-07-18 (×9): 10 mg via ORAL
  Filled 2019-07-09 (×9): qty 1

## 2019-07-09 MED ORDER — FLEET ENEMA 7-19 GM/118ML RE ENEM
1.0000 | ENEMA | Freq: Once | RECTAL | Status: AC
  Administered 2019-07-09: 09:00:00 1 via RECTAL
  Filled 2019-07-09: qty 1

## 2019-07-09 MED ORDER — DIVALPROEX SODIUM 125 MG PO DR TAB
125.0000 mg | DELAYED_RELEASE_TABLET | Freq: Every day | ORAL | Status: DC
  Administered 2019-07-09 – 2019-07-18 (×10): 125 mg via ORAL
  Filled 2019-07-09 (×11): qty 1

## 2019-07-09 MED ORDER — POLYETHYLENE GLYCOL 3350 17 G PO PACK
17.0000 g | PACK | Freq: Two times a day (BID) | ORAL | Status: DC
  Administered 2019-07-09 – 2019-07-15 (×13): 17 g via ORAL
  Filled 2019-07-09 (×14): qty 1

## 2019-07-09 MED ORDER — ONDANSETRON HCL 4 MG/2ML IJ SOLN
4.0000 mg | Freq: Four times a day (QID) | INTRAMUSCULAR | Status: DC | PRN
  Administered 2019-07-17 – 2019-07-19 (×3): 4 mg via INTRAVENOUS
  Filled 2019-07-09 (×3): qty 2

## 2019-07-09 MED ORDER — PAROXETINE HCL 10 MG PO TABS
10.0000 mg | ORAL_TABLET | Freq: Every day | ORAL | Status: DC
  Administered 2019-07-09 – 2019-07-18 (×10): 10 mg via ORAL
  Filled 2019-07-09 (×11): qty 1

## 2019-07-09 MED ORDER — INSULIN ASPART 100 UNIT/ML IV SOLN
5.0000 [IU] | Freq: Once | INTRAVENOUS | Status: AC
  Administered 2019-07-09: 08:00:00 5 [IU] via INTRAVENOUS
  Filled 2019-07-09: qty 0.05

## 2019-07-09 MED ORDER — MIRTAZAPINE 15 MG PO TABS
7.5000 mg | ORAL_TABLET | Freq: Every day | ORAL | Status: DC
  Administered 2019-07-09 – 2019-07-18 (×10): 7.5 mg via ORAL
  Filled 2019-07-09 (×10): qty 1

## 2019-07-09 NOTE — ED Triage Notes (Signed)
Pt is from a memory care center and they say that they noticed her having diarrhea and a fever at 6am yesterday, they decided to send her in at 2am for evaluation

## 2019-07-09 NOTE — H&P (Signed)
History and Physical  Alicia Benitez PPI:[id number] DOB: 1935/10/05 DOA: 07/18/2019   Patient coming from: SNF/memory care unit  Chief Complaint: Diarrhea  HPI: Alicia Benitez is a 83 y.o. female with medical history significant for advanced dementia from memory care unit, hypothyroidism, hypertension, anxiety was brought from memory care unit with complaints of reported fever and diarrhea for about a day.  Limited history from both the memory care unit as well as patient secondary to dementia.  Patient is alert, mumbles incoherently, not able to answer any questions.  Level 5 caveat  ED Course: Afebrile, mildly hypotensive, saturating well on room air, labs show WBC 12.7, hyperkalemia with potassium 6.5, AKI with creatinine 1.77, BUN 74.  UA unremarkable, chest x-ray pending, CT abdomen showed large stool burden concerning for fecal impaction.  Review of Systems: Review of systems are otherwise negative   Past Medical History:  Diagnosis Date  . Anxiety   . Dementia ([place])   . Hypercholesteremia   . Hypertension   . Osteoporosis   . Thyroid disease    Past Surgical History:  Procedure Laterality Date  . ABDOMINAL HYSTERECTOMY    . APPENDECTOMY    . BREAST BIOPSY Left   . excision of meckel diverticulum    . IM Nailing Hip fracture    . salpingophorectomy      Social History:  reports that she has never smoked. She has never used smokeless tobacco. No history on file for alcohol and drug.   Allergies  Allergen Reactions  . Phenytoin     On MAR  . Yellow Jacket Venom [Bee Venom]     Family History  Problem Relation Age of Onset  . Hyperlipidemia Mother   . Diabetes Brother   . Cancer Son       Prior to Admission medications   Medication Sig Start Date End Date Taking? Authorizing Provider  acetaminophen (TYLENOL) 325 MG tablet Take 650 mg by mouth every 8 (eight) hours as needed for mild pain or moderate pain.    Yes [provider]  Amino Acids-Protein  Hydrolys (FEEDING SUPPLEMENT, PRO-STAT SUGAR FREE 64,) LIQD Take 30 mLs by mouth 2 (two) times daily.   Yes [provider]  aspirin 81 MG chewable tablet Chew 81 mg by mouth daily.    Yes [provider]  Cholecalciferol (VITAMIN D3) 1.25 MG (50000 UT) CAPS Take 1 capsule by mouth once a week.   Yes [provider]  divalproex (DEPAKOTE) 125 MG DR tablet Take 125 mg by mouth at bedtime.    Yes [provider]  famotidine (PEPCID) 20 MG tablet Take 20 mg by mouth 2 (two) times daily.    Yes [provider]  levothyroxine (SYNTHROID, LEVOTHROID) 88 MCG tablet Take 88 mcg by mouth daily before breakfast.    Yes [provider]  lisinopril (PRINIVIL,ZESTRIL) 20 MG tablet Take 20 mg by mouth daily.    Yes [provider]  loperamide (IMODIUM) 2 MG capsule Take 2 mg by mouth as needed for diarrhea or loose stools.   Yes [provider]  metoprolol tartrate (LOPRESSOR) 25 MG tablet Take 25 mg by mouth 2 (two) times daily.    Yes [provider]  mirtazapine (REMERON) 7.5 MG tablet Take 7.5 mg by mouth at bedtime.   Yes [provider]  Nutritional Supplements (NUTRITIONAL SUPPLEMENT PO) Take 1 each by mouth 2 (two) times a day. Magic Cup lunch and dinner   Yes [provider]  PARoxetine (  PAXIL) 10 MG tablet Take 10 mg by mouth daily.   Yes [provider]  potassium chloride (MICRO-K) 10 MEQ CR capsule Take 10 mEq by mouth 2 (two) times daily.    Yes [provider]    Physical Exam: BP (!) 111/47 (BP Location: Right Arm)   Pulse 86   Temp 98.4 F (36.9 C) (Oral)   Resp (!) 21   SpO2 97%   General: NAD, alert/awake, not oriented, mumbles incoherently, appears dry, cachectic, chronically ill-appearing Eyes: Normal ENT: Normal Neck: Supple Cardiovascular: S1, S2 present Respiratory: CTA B Abdomen: Soft, nontender, nondistended, bowel sounds present Skin: Normal Musculoskeletal:  No pedal edema bilaterally Psychiatric: Unable to assess Neurologic: No obvious focal neurologic deficits, does not follow commands          Labs on Admission:  Basic Metabolic Panel: Recent Labs  Lab 07/01/2019 0611  NA 136  K 6.5*  CL 106  CO2 19*  GLUCOSE 91  BUN 74*  CREATININE 1.77*  CALCIUM 8.3*   Liver Function Tests: Recent Labs  Lab 07/22/2019 0611  AST 14*  ALT 6  ALKPHOS 63  BILITOT 0.5  PROT 5.5*  ALBUMIN 2.6*   No results for input(s): LIPASE, AMYLASE in the last 168 hours. No results for input(s): AMMONIA in the last 168 hours. CBC: Recent Labs  Lab 07/24/2019 0500  WBC 12.7*  NEUTROABS 9.4*  HGB 11.7*  HCT 37.5  MCV 96.2  PLT 258   Cardiac Enzymes: No results for input(s): CKTOTAL, CKMB, CKMBINDEX, TROPONINI in the last 168 hours.  BNP (last 3 results) No results for input(s): BNP in the last 8760 hours.  ProBNP (last 3 results) No results for input(s): PROBNP in the last 8760 hours.  CBG: Recent Labs  Lab 07/03/2019 0844  GLUCAP 158*    Radiological Exams on Admission: Ct Abdomen Pelvis Wo Contrast  Result Date: 07/22/2019 CLINICAL DATA:  Abdominal pain EXAM: CT ABDOMEN AND PELVIS WITHOUT CONTRAST TECHNIQUE: Multidetector CT imaging of the abdomen and pelvis was performed following the standard protocol without IV contrast. COMPARISON:  11/08/2018 FINDINGS: Lower chest: No acute abnormality. Hepatobiliary: No focal hepatic abnormality. Gallbladder unremarkable. Pancreas: No focal abnormality or ductal dilatation. Spleen: No focal abnormality.  Normal size. Adrenals/Urinary Tract: No visible focal renal or adrenal mass. No hydronephrosis. Urinary bladder unremarkable. Stomach/Bowel: Marked distention of the rectosigmoid colon with large stool burden concerning for fecal impaction. Stomach and small bowel decompressed, grossly unremarkable. Vascular/Lymphatic: Aortic atherosclerosis. No enlarged abdominal or pelvic lymph nodes. Reproductive: Prior  hysterectomy.  No adnexal masses. Other: No free fluid or free air. Musculoskeletal: Chronic L1 compression fracture. No acute bony abnormality. IMPRESSION: Large stool burden and distention of the rectosigmoid colon concerning for fecal impaction. Otherwise no acute findings in the abdomen or pelvis. Aortic atherosclerosis. Electronically Signed   By: Charlett Nose M.D.   On: 07/14/2019 08:54    EKG: Independently reviewed, normal sinus rhythm, no acute ST changes  Assessment/Plan Present on Admission: . Diarrhea with dehydration . Hypertension . Hypothyroidism . Dementia with behavioral disturbance (HCC) . Depression, major, single episode, in partial remission (HCC)  Principal Problem:   Diarrhea with dehydration Active Problems:   Hypertension   Hypothyroidism   Depression, major, single episode, in partial remission (HCC)   Dementia with behavioral disturbance (HCC)  ??Possible diarrhea likely 2/2 overflow from significant fecal impaction Reported to have diarrhea at memory care unit, no witnessed diarrhea since admission Currently afebrile, with leukocytosis CT abdomen/pelvis showed significant  fecal impaction, no bowel obstruction EDP ordered Fleet enema Strict bowel regimen with Senokot twice daily, MiraLAX twice daily Monitor closely  History of fever at memory care unit Currently afebrile, with leukocytosis BC x2 pending collection UA negative for infection Chest x-ray pending Will monitor for now without any antibiotics pending further work-up  AKI Baseline creatinine within normal limits Creatinine at admission 1.77, BUN 74 Likely due to poor oral intake, with continuation of antihypertensive lisinopril CT abdomen/pelvis without any hydronephrosis Status post IV bolus, continue maintenance IV fluid Hold home lisinopril Daily BMP  Hyperkalemia Likely 2/2 oral potassium in addition to lisinopril use Status post insulin +50% dextrose in the ED Hold home  lisinopril, oral potassium Frequent BMP checks  Hypertension BP on the soft side Hold home lisinopril, metoprolol Continue IV fluids  Hypothyroidism TSH, free T4 pending Continue Synthroid  GERD Continue famotidine  Depression Continue Paxil, Remeron, Depakote  Advanced dementia Delirium precautions    DVT prophylaxis: Heparin  Code Status: Full  Family Communication: None at bedside  Disposition Plan: Likely back to memory care unit  Consults called: None  Admission status: Observation telemetry    Briant CedarNkeiruka J Shirlene Andaya MD [facility]   If 7PM-7AM, please contact night-coverage [email or web address]  Oct 15, 2018, 9:42 AM

## 2019-07-09 NOTE — ED Provider Notes (Signed)
Care assumed from [place], PA-C at shift change with labs and CT and pelvis pending.  In brief, this patient is a 83 y.o. F with PMH/o Dementia, was from a memory care unit who presents for evaluation of fever and diarrhea.  Sent home reported fever (unknown number) yesterday as well as some episodes of nonbloody diarrhea.  Patient cannot provide much history secondary to dementia.    Physical Exam  BP (!) 99/47 (BP Location: Right Arm) Comment: Unable to get patient to lay flat . Patient was laying on side while BP was taken.   Pulse 77   Temp 98.4 F (36.9 C) (Oral)   Resp 15   SpO2 97%   Physical Exam  ED Course/Procedures     Procedures   Results for orders placed or performed during the hospital encounter of 07/07/2019 (from the past 48 hour(s))  Urinalysis, Routine w reflex microscopic     Status: Abnormal   Collection Time: 07/13/2019  3:06 AM  Result Value Ref Range   Color, Urine AMBER (A) YELLOW    Comment: BIOCHEMICALS MAY BE AFFECTED BY COLOR   APPearance CLEAR CLEAR   Specific Gravity, Urine 1.020 1.005 - 1.030   pH 5.0 5.0 - 8.0   Glucose, UA NEGATIVE NEGATIVE mg/dL   Hgb urine dipstick NEGATIVE NEGATIVE   Bilirubin Urine SMALL (A) NEGATIVE   Ketones, ur 5 (A) NEGATIVE mg/dL   Protein, ur NEGATIVE NEGATIVE mg/dL   Nitrite NEGATIVE NEGATIVE   Leukocytes,Ua NEGATIVE NEGATIVE    Comment: Performed at [facility], [place] [address] [place], [place] [address]  CBC with Differential/Platelet     Status: Abnormal   Collection Time: 06/30/2019  5:00 AM  Result Value Ref Range   WBC 12.7 (H) 4.0 - 10.5 K/uL   RBC 3.90 3.87 - 5.11 MIL/uL   Hemoglobin 11.7 (L) 12.0 - 15.0 g/dL   HCT 37.5 36.0 - 46.0 %   MCV 96.2 80.0 - 100.0 fL   MCH 30.0 26.0 - 34.0 pg   MCHC 31.2 30.0 - 36.0 g/dL   RDW 15.5 11.5 - 15.5 %   Platelets 258 150 - 400 K/uL   nRBC 0.0 0.0 - 0.2 %   Neutrophils Relative % 49 %   Neutro Abs 9.4 (H) 1.7 - 7.7 K/uL   Band Neutrophils  25 %   Lymphocytes Relative 16 %   Lymphs Abs 2.0 0.7 - 4.0 K/uL   Monocytes Relative 10 %   Monocytes Absolute 1.3 (H) 0.1 - 1.0 K/uL   Eosinophils Relative 0 %   Eosinophils Absolute 0.0 0.0 - 0.5 K/uL   Basophils Relative 0 %   Basophils Absolute 0.0 0.0 - 0.1 K/uL   WBC Morphology MILD LEFT SHIFT (1-5% METAS, OCC MYELO, OCC BANDS)     Comment: VACUOLATED NEUTROPHILS   Abs Immature Granulocytes 0.00 0.00 - 0.07 K/uL    Comment: Performed at [facility], [place] [address] [place], [place] [address]  Comprehensive metabolic panel     Status: Abnormal   Collection Time: 07/18/2019  6:11 AM  Result Value Ref Range   Sodium 136 135 - 145 mmol/L   Potassium 6.5 (HH) 3.5 - 5.1 mmol/L    Comment: NO VISIBLE HEMOLYSIS CRITICAL RESULT CALLED TO, READ BACK BY AND VERIFIED WITH: Cindi Carbon RN AT 4627 07/14/2019 J. PERRY    Chloride 106 98 - 111 mmol/L   CO2 19 (L) 22 - 32 mmol/L   Glucose, Bld  91 70 - 99 mg/dL   BUN 74 (H) 8 - 23 mg/dL   Creatinine, Ser 9.14 (H) 0.44 - 1.00 mg/dL   Calcium 8.3 (L) 8.9 - 10.3 mg/dL   Total Protein 5.5 (L) 6.5 - 8.1 g/dL   Albumin 2.6 (L) 3.5 - 5.0 g/dL   AST 14 (L) 15 - 41 U/L   ALT 6 0 - 44 U/L   Alkaline Phosphatase 63 38 - 126 U/L   Total Bilirubin 0.5 0.3 - 1.2 mg/dL   GFR calc non Af Amer 26 (L) >60 mL/min   GFR calc Af Amer 31 (L) >60 mL/min   Anion gap 11 5 - 15    Comment: Performed at [facility], [address], [place], [place] [address]  CBG monitoring, ED     Status: Abnormal   Collection Time: 07/08/2019  8:44 AM  Result Value Ref Range   Glucose-Capillary 158 (H) 70 - 99 mg/dL    .Critical Care Performed by: Maxwell Caul, PA-C Authorized by: Maxwell Caul, PA-C   Critical care provider statement:    Critical care time (minutes):  35   Critical care was necessary to treat or prevent imminent or life-threatening deterioration of the following conditions:  Metabolic crisis   Critical  care was time spent personally by me on the following activities:  Discussions with consultants, evaluation of patient's response to treatment, examination of patient, ordering and performing treatments and interventions, ordering and review of laboratory studies, ordering and review of radiographic studies, pulse oximetry, re-evaluation of patient's condition, obtaining history from patient or surrogate and review of old charts     MDM   CMP shows potassium of 6.5, BUN of 74, creatinine of 1.77.  She had blood work done in July 2020 which showed normal BUN and creatinine.  This is an increase for her.  CBC with slight leukocytosis of 12.7, hemoglobin 11.7.  UA shows no leuks, nitrites.  Given hyperkalemia, patient given insulin, fluids.  CT on pelvis shows large stool burden and distention.  No other acute findings.  Given hyperkalemia, acute kidney injury, will plan for admission.  Discussed with hospitalist.  We will plan to admit.  Requests Fleet enema and maintenance fluids at 100 cc/h be done.  1. Hyperkalemia   2. Acute kidney injury (HCC)   3. Diarrhea, unspecified type    Portions of this note were generated with [employer]. Dictation errors may occur despite best attempts at proofreading.    Maxwell Caul, PA-C 06/26/2019 1016    Virgina Norfolk, DO 07/14/2019 1502

## 2019-07-09 NOTE — ED Provider Notes (Signed)
[facility] DEPT Provider Note   CSN: [id number] Arrival date & time: August 04, 2019  0243     History   Chief Complaint Chief Complaint  Patient presents with  . Diarrhea    HPI Alicia Benitez is a 83 y.o. female.     Patient presents to the emergency department from a memory care unit with a chief complaint of reported fever yesterday and diarrhea yesterday.  Unable to obtain history from patient secondary to dementia.  Level 5 caveat applies.  The history is provided by the nursing home. No language interpreter was used.    Past Medical History:  Diagnosis Date  . Anxiety   . Dementia ([place])   . Hypercholesteremia   . Hypertension   . Osteoporosis   . Thyroid disease     Patient Active Problem List   Diagnosis Date Noted  . Mood disorder ([place]) 03/08/2019  . Compression fracture of L1 lumbar vertebra ([place]) 12/21/2018  . Closed fracture of transverse process of thoracic vertebra ([place]) 12/21/2018  . Hypertension 12/21/2018  . Hypothyroidism 12/21/2018  . 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 GERD (gastroesophageal reflux disease) 12/21/2018  . Dementia with behavioral disturbance ([place]) 12/21/2018  . Multiple rib fractures 12/21/2018  . Lung contusion 12/21/2018  . Closed fracture dislocation of lumbar spine ([place]) 11/08/2018  . Thoracic spine fracture ([place]) 10/21/2018    Past Surgical History:  Procedure Laterality Date  . ABDOMINAL HYSTERECTOMY    . APPENDECTOMY    . BREAST BIOPSY Left   . excision of meckel diverticulum    . IM Nailing Hip fracture    . salpingophorectomy       OB History   No obstetric history on file.      Home Medications    Prior to Admission medications   Medication Sig Start Date End Date Taking? Authorizing Provider  acetaminophen (TYLENOL) 325 MG tablet Take 650 mg by mouth every 8 (eight) hours as needed for mild pain or moderate pain.     [provider]  aspirin 81 MG chewable tablet Chew 81 mg by mouth daily.    [provider]  bisacodyl (DULCOLAX) 10 MG suppository Place 10 mg rectally as needed for moderate constipation.    [provider]  Cholecalciferol (VITAMIN D3) 1.25 MG (50000 UT) CAPS Take 1 capsule by mouth once a week.    [provider]  divalproex (DEPAKOTE) 125 MG DR tablet Take 125 mg by mouth 2 (two) times daily.    [provider]  famotidine (PEPCID) 20 MG tablet Take 20 mg by mouth 2 (two) times daily.     [provider]  levothyroxine (SYNTHROID, LEVOTHROID) 88 MCG tablet Take 88 mcg by mouth daily before breakfast.     [provider]  lisinopril (PRINIVIL,ZESTRIL) 20 MG tablet Take 20 mg by mouth daily.     [provider]  magnesium hydroxide (MILK OF MAGNESIA) 400 MG/5ML suspension Take 30 mLs by mouth daily as needed for mild constipation.    [provider]  metoprolol tartrate (LOPRESSOR) 25 MG tablet Take 25 mg by mouth 2 (two) times daily.     [provider]  NON FORMULARY Diet Type:  Regular, Liberalized diet 11/14/18   [provider]  Nutritional Supplement LIQD Take 4 oz by mouth 3 (three) times daily. MedPass     [provider]  Nutritional Supplements (NUTRITIONAL SUPPLEMENT PO) Take 1 each by mouth 2 (  two) times a day. Magic Cup lunch and dinner    [provider]  PARoxetine (PAXIL) 20 MG tablet Take 20 mg by mouth daily.    [provider]  potassium chloride (MICRO-K) 10 MEQ CR capsule Take 10 mEq by mouth 2 (two) times daily.     [provider]    Family History Family History  Problem Relation Age of Onset  . Hyperlipidemia Mother   . Diabetes Brother   . Cancer Son     Social History Social History   Tobacco Use  . Smoking status: Never Smoker  . Smokeless tobacco: Never Used  Substance Use Topics  . Alcohol use: Not on file    Comment: No  .  Drug use: Not on file    Comment: No     Allergies   Phenytoin and Yellow jacket venom [bee venom]   Review of Systems Review of Systems  Unable to perform ROS: Dementia     Physical Exam Updated Vital Signs BP (!) 116/35 (BP Location: Left Arm)   Pulse 62   Temp 99.4 F (37.4 C) (Rectal)   Resp 17   Physical Exam Vitals signs and nursing note reviewed.  Constitutional:      General: She is not in acute distress.    Appearance: She is well-developed.  HENT:     Head: Normocephalic and atraumatic.  Eyes:     Conjunctiva/sclera: Conjunctivae normal.  Neck:     Musculoskeletal: Neck supple.  Cardiovascular:     Rate and Rhythm: Normal rate and regular rhythm.     Heart sounds: No murmur.  Pulmonary:     Effort: Pulmonary effort is normal. No respiratory distress.     Breath sounds: Normal breath sounds.  Abdominal:     Palpations: Abdomen is soft.     Tenderness: There is abdominal tenderness.  Musculoskeletal: Normal range of motion.  Skin:    General: Skin is warm and dry.  Neurological:     Comments: Awakens to verbal stimulus Not oriented to person, place, or time  Psychiatric:     Comments: Unable to assess      ED Treatments / Results  Labs (all labs ordered are listed, but only abnormal results are displayed) Labs Reviewed  URINALYSIS, ROUTINE W REFLEX MICROSCOPIC - Abnormal; Notable for the following components:      Result Value   Color, Urine AMBER (*)    Bilirubin Urine SMALL (*)    Ketones, ur 5 (*)    All other components within normal limits  CBC WITH DIFFERENTIAL/PLATELET  COMPREHENSIVE METABOLIC PANEL    EKG None  Radiology No results found.  Procedures Procedures (including critical care time)  Medications Ordered in ED Medications - No data to display   Initial Impression / Assessment and Plan / ED Course  I have reviewed the triage vital signs and the nursing notes.  Pertinent labs & imaging results that were  available during my care of the patient were reviewed by me and considered in my medical decision making (see chart for details).        Patient from memory care unit.  Unable to assist with hx.  Groans when I press on her abdomen.  Reported fever and diarrhea.  Will check labs and Ct.  Patient seen by and discussed with Dr. Nicanor AlconPalumbo, who agrees with plan.  6:56 AM Patient signed out to [place], [place].  Final Clinical Impressions(s) / ED Diagnoses   Final diagnoses:  None  ED Discharge Orders    None       Roxy Horseman, Cordelia Poche 07/02/2019 1093    Palumbo, April, MD 07/03/2019 [phone]

## 2019-07-09 NOTE — Progress Notes (Signed)
PT Cancellation Note  Patient Details Name: Alicia Benitez MRN: [id number] DOB: [birth year]   Cancelled Treatment:    Reason Eval/Treat Not Completed: Medical issues which prohibited therapy RN reports pt would not be able to participate with PT at this time.  RN states pt is currently in fetal position.  Pt from memory care unit.  Uncertain of previous level of functioning.  Will check back as schedule permits.   Wandell Scullion,KATHrine E 08/03/2019, 2:06 PM Carmelia Bake, PT, DPT Acute Rehabilitation Services Office: [phone] Pager: [phone]

## 2019-07-09 NOTE — Progress Notes (Signed)
Initial Nutrition Assessment  RD working remotely.   DOCUMENTATION CODES:   Underweight  INTERVENTION:  - will order Ensure Enlive BID, each supplement provides 350 kcal and 20 grams of protein. - will order Magic Cup BID with meals, each supplement provides 290 kcal and 9 grams of protein. - will order daily multivitamin with minerals. - continue to encourage PO intakes.    NUTRITION DIAGNOSIS:   Increased nutrient needs related to wound healing as evidenced by estimated needs.  GOAL:   Patient will meet greater than or equal to 90% of their needs  MONITOR:   PO intake, Supplement acceptance, Labs, Weight trends, Skin  REASON FOR ASSESSMENT:   Consult Assessment of nutrition requirement/status  ASSESSMENT:   83 y.o. female with medical history significant for advanced dementia from memory care unit, hypothyroidism, HTN, and anxiety. She was taken from memory care unit to the ED with a fever and diarrhea for ~24 hours. Patient mumbles, unable to hold a conversation. CT abdomen showed large stool burden concerning for fecal impaction.  Per flow sheet documentation, patient is disoriented x4 and ate 0% of lunch today. Per chart review, current weight is 108 lb and weight has been stable over the past 4 months.   Patient is on airborne and contact precautions.   Per notes: - possible diarrhea thought to be 2/2 overflow from significant fecal impaction--significant fecal impaction noted on CT abdomen/pelvis, no noted bowel obstruction; bowel regimen ordered - CXR pending - AKI--no hydronephrosis noted on CT abdomen/pelvis - hyperkalemia--resolved - advanced dementia   Labs reviewed; CBGs: 158 and 59 mg/dl, BUN: 67 mg/dl, creatinine: 1.59 mg/dl, Ca: 7.6 mg/dl, GFR: 30 ml/min. Medications reviewed; 10 mg oral pepcid/day, 88 mcg oral synthroid/day, 1 packet miralax BID, 1 tablet senokot BID. IVF; D5-1/2 NS @ 75 ml/hr (306 kcal).     NUTRITION - FOCUSED PHYSICAL  EXAM:  unable to complete at this time.   Diet Order:   Diet Order            Diet regular Room service appropriate? Yes; Fluid consistency: Thin  Diet effective now              EDUCATION NEEDS:   No education needs have been identified at this time  Skin:  Skin Assessment: Skin Integrity Issues: Skin Integrity Issues:: Stage II, DTI, Unstageable DTI: L hip Stage II: L hip Unstageable: full thickness L IT  Last BM:  10/14  Height:   Ht Readings from Last 1 Encounters:  07/19/2019 5\' 6"  (1.676 m)    Weight:   Wt Readings from Last 1 Encounters:  Jul 19, 2019 49 kg    Ideal Body Weight:  59.1 kg  BMI:  Body mass index is 17.44 kg/m.  Estimated Nutritional Needs:   Kcal:  1735-1960 kcal  Protein:  75-85 grams  Fluid:  >/= 2 L/day      Jarome Matin, MS, RD, LDN, [facility] Inpatient Clinical Dietitian Pager # [phone] After hours/weekend pager # [phone]

## 2019-07-10 DIAGNOSIS — E46 Unspecified protein-calorie malnutrition: Secondary | ICD-10-CM | POA: Diagnosis present

## 2019-07-10 DIAGNOSIS — R197 Diarrhea, unspecified: Secondary | ICD-10-CM | POA: Diagnosis present

## 2019-07-10 DIAGNOSIS — F0391 Unspecified dementia with behavioral disturbance: Secondary | ICD-10-CM | POA: Diagnosis not present

## 2019-07-10 DIAGNOSIS — N390 Urinary tract infection, site not specified: Secondary | ICD-10-CM | POA: Diagnosis present

## 2019-07-10 DIAGNOSIS — Z20828 Contact with and (suspected) exposure to other viral communicable diseases: Secondary | ICD-10-CM | POA: Diagnosis present

## 2019-07-10 DIAGNOSIS — F0281 Dementia in other diseases classified elsewhere with behavioral disturbance: Secondary | ICD-10-CM | POA: Diagnosis present

## 2019-07-10 DIAGNOSIS — J9601 Acute respiratory failure with hypoxia: Secondary | ICD-10-CM | POA: Diagnosis not present

## 2019-07-10 DIAGNOSIS — K5641 Fecal impaction: Secondary | ICD-10-CM | POA: Diagnosis present

## 2019-07-10 DIAGNOSIS — Z681 Body mass index (BMI) 19 or less, adult: Secondary | ICD-10-CM | POA: Diagnosis not present

## 2019-07-10 DIAGNOSIS — K567 Ileus, unspecified: Secondary | ICD-10-CM | POA: Diagnosis present

## 2019-07-10 DIAGNOSIS — R609 Edema, unspecified: Secondary | ICD-10-CM | POA: Diagnosis not present

## 2019-07-10 DIAGNOSIS — Z515 Encounter for palliative care: Secondary | ICD-10-CM | POA: Diagnosis not present

## 2019-07-10 DIAGNOSIS — F419 Anxiety disorder, unspecified: Secondary | ICD-10-CM | POA: Diagnosis present

## 2019-07-10 DIAGNOSIS — E872 Acidosis: Secondary | ICD-10-CM | POA: Diagnosis not present

## 2019-07-10 DIAGNOSIS — E875 Hyperkalemia: Secondary | ICD-10-CM | POA: Diagnosis present

## 2019-07-10 DIAGNOSIS — E7800 Pure hypercholesterolemia, unspecified: Secondary | ICD-10-CM | POA: Diagnosis present

## 2019-07-10 DIAGNOSIS — E039 Hypothyroidism, unspecified: Secondary | ICD-10-CM | POA: Diagnosis present

## 2019-07-10 DIAGNOSIS — N179 Acute kidney failure, unspecified: Secondary | ICD-10-CM | POA: Diagnosis present

## 2019-07-10 DIAGNOSIS — I959 Hypotension, unspecified: Secondary | ICD-10-CM | POA: Diagnosis present

## 2019-07-10 DIAGNOSIS — F324 Major depressive disorder, single episode, in partial remission: Secondary | ICD-10-CM | POA: Diagnosis not present

## 2019-07-10 DIAGNOSIS — I472 Ventricular tachycardia: Secondary | ICD-10-CM | POA: Diagnosis present

## 2019-07-10 DIAGNOSIS — Z66 Do not resuscitate: Secondary | ICD-10-CM | POA: Diagnosis not present

## 2019-07-10 DIAGNOSIS — K5939 Other megacolon: Secondary | ICD-10-CM | POA: Diagnosis present

## 2019-07-10 DIAGNOSIS — R52 Pain, unspecified: Secondary | ICD-10-CM | POA: Diagnosis not present

## 2019-07-10 DIAGNOSIS — K5900 Constipation, unspecified: Secondary | ICD-10-CM | POA: Diagnosis not present

## 2019-07-10 DIAGNOSIS — E860 Dehydration: Secondary | ICD-10-CM | POA: Diagnosis present

## 2019-07-10 DIAGNOSIS — R64 Cachexia: Secondary | ICD-10-CM | POA: Diagnosis present

## 2019-07-10 DIAGNOSIS — K219 Gastro-esophageal reflux disease without esophagitis: Secondary | ICD-10-CM | POA: Diagnosis present

## 2019-07-10 DIAGNOSIS — I10 Essential (primary) hypertension: Secondary | ICD-10-CM | POA: Diagnosis present

## 2019-07-10 LAB — COMPREHENSIVE METABOLIC PANEL
ALT: 7 U/L (ref 0–44)
AST: 11 U/L — ABNORMAL LOW (ref 15–41)
Albumin: 2.1 g/dL — ABNORMAL LOW (ref 3.5–5.0)
Alkaline Phosphatase: 50 U/L (ref 38–126)
Anion gap: 9 (ref 5–15)
BUN: 48 mg/dL — ABNORMAL HIGH (ref 8–23)
CO2: 18 mmol/L — ABNORMAL LOW (ref 22–32)
Calcium: 7.4 mg/dL — ABNORMAL LOW (ref 8.9–10.3)
Chloride: 112 mmol/L — ABNORMAL HIGH (ref 98–111)
Creatinine, Ser: 1.05 mg/dL — ABNORMAL HIGH (ref 0.44–1.00)
GFR calc Af Amer: 58 mL/min — ABNORMAL LOW (ref >60)
GFR calc non Af Amer: 50 mL/min — ABNORMAL LOW (ref >60)
Glucose, Bld: 96 mg/dL (ref 70–99)
Potassium: 2.7 mmol/L — CL (ref 3.5–5.1)
Sodium: 139 mmol/L (ref 135–145)
Total Bilirubin: 0.7 mg/dL (ref 0.3–1.2)
Total Protein: 4.5 g/dL — ABNORMAL LOW (ref 6.5–8.1)

## 2019-07-10 LAB — CBC
HCT: 28.3 % — ABNORMAL LOW (ref 36.0–46.0)
Hemoglobin: 8.9 g/dL — ABNORMAL LOW (ref 12.0–15.0)
MCH: 29.6 pg (ref 26.0–34.0)
MCHC: 31.4 g/dL (ref 30.0–36.0)
MCV: 94 fL (ref 80.0–100.0)
Platelets: 203 10*3/uL (ref 150–400)
RBC: 3.01 MIL/uL — ABNORMAL LOW (ref 3.87–5.11)
RDW: 15.3 % (ref 11.5–15.5)
WBC: 6.4 10*3/uL (ref 4.0–10.5)
nRBC: 0 % (ref 0.0–0.2)

## 2019-07-10 LAB — GLUCOSE, CAPILLARY
Glucose-Capillary: 105 mg/dL — ABNORMAL HIGH (ref 70–99)
Glucose-Capillary: 106 mg/dL — ABNORMAL HIGH (ref 70–99)
Glucose-Capillary: 91 mg/dL (ref 70–99)

## 2019-07-10 MED ORDER — POTASSIUM CHLORIDE CRYS ER 20 MEQ PO TBCR
40.0000 meq | EXTENDED_RELEASE_TABLET | Freq: Four times a day (QID) | ORAL | Status: AC
  Administered 2019-07-10 (×2): 40 meq via ORAL
  Filled 2019-07-10 (×2): qty 2

## 2019-07-10 MED ORDER — SODIUM CHLORIDE 0.9 % IV BOLUS
500.0000 mL | Freq: Once | INTRAVENOUS | Status: AC
  Administered 2019-07-10: 500 mL via INTRAVENOUS

## 2019-07-10 MED ORDER — POTASSIUM CHLORIDE CRYS ER 20 MEQ PO TBCR
40.0000 meq | EXTENDED_RELEASE_TABLET | Freq: Four times a day (QID) | ORAL | Status: DC
  Filled 2019-07-10: qty 2

## 2019-07-10 MED ORDER — SODIUM CHLORIDE 0.9 % IV BOLUS
1000.0000 mL | Freq: Once | INTRAVENOUS | Status: AC
  Administered 2019-07-10: 1000 mL via INTRAVENOUS

## 2019-07-10 NOTE — Progress Notes (Signed)
HR sustaining 135-142 now, which is an increase from previous episodes when HR would sustain intermittently at 130. BP 97/69. Paged night floor coverage to make them aware of increase. Awaiting return phone call. Changes reported to night RN. Night RN to f/u with HR and any new orders.   Lyndie Vanderloop, Fraser Din 07/10/2019 7:41 PM

## 2019-07-10 NOTE — Evaluation (Signed)
Physical Therapy Evaluation Patient Details Name: Alicia Benitez MRN: [id number] DOB: 09-26-1935 Today's Date: 07/10/2019   History of Present Illness  83 y.o. female with medical history significant for advanced dementia from memory care unit, hypothyroidism, hypertension, anxiety was brought from memory care unit with complaints of reported fever and diarrhea  Clinical Impression  Pt admitted with above diagnosis.  Pt currently with functional limitations due to the deficits listed below (see PT Problem List). Pt will benefit from skilled PT to increase their independence and safety with mobility to allow discharge to the venue listed below.  Pt requiring max-total assist for bed mobility at this time.  Pt poor historian however per chart, pt is from memory care unit.       Follow Up Recommendations SNF    Equipment Recommendations  None recommended by PT    Recommendations for Other Services       Precautions / Restrictions Precautions Precautions: Fall Restrictions Weight Bearing Restrictions: No      Mobility  Bed Mobility Overal bed mobility: Needs Assistance Bed Mobility: Rolling;Sidelying to Sit;Sit to Sidelying Rolling: Max assist Sidelying to sit: Total assist     Sit to sidelying: Total assist General bed mobility comments: pt only attempting to assist with UEs however too weak and required complete assist most of the time  Transfers                 General transfer comment: not tested for safety  Ambulation/Gait                Stairs            Wheelchair Mobility    Modified Rankin (Stroke Patients Only)       Balance Overall balance assessment: Needs assistance Sitting-balance support: Single extremity supported;Feet supported Sitting balance-Leahy Scale: Poor Sitting balance - Comments: requires UE support                                     Pertinent Vitals/Pain Pain Assessment: Faces Faces Pain Scale:  Hurts little more Pain Location: legs with assisting onto bed Pain Descriptors / Indicators: Grimacing Pain Intervention(s): Monitored during session;Repositioned    Home Living Family/patient expects to be discharged to:: Skilled nursing facility                 Additional Comments: per notes from SNF, memory care unit    Prior Function Level of Independence: Needs assistance         Comments: pt poor historian, likely required assist for ADLs, uncertain of mobility baseline     Hand Dominance        Extremity/Trunk Assessment   Upper Extremity Assessment Upper Extremity Assessment: Generalized weakness    Lower Extremity Assessment Lower Extremity Assessment: Generalized weakness       Communication   Communication: No difficulties  Cognition Arousal/Alertness: Lethargic   Overall Cognitive Status: History of cognitive impairments - at baseline                                 General Comments: hx of dementia, likely at baseline, pt attempting to communicate however nonsensical most of the time.      General Comments      Exercises     Assessment/Plan    PT Assessment Patient needs continued PT services  PT Problem  List Decreased strength;Decreased mobility;Decreased activity tolerance;Decreased balance;Decreased knowledge of use of DME;Decreased cognition       PT Treatment Interventions DME instruction;Therapeutic activities;Gait training;Functional mobility training;Balance training;Therapeutic exercise;Patient/family education;Wheelchair mobility training    PT Goals (Current goals can be found in the Care Plan section)  Acute Rehab PT Goals PT Goal Formulation: Patient unable to participate in goal setting Time For Goal Achievement: 07/24/19 Potential to Achieve Goals: Fair    Frequency Min 1X/week   Barriers to discharge        Co-evaluation               AM-PAC PT "6 Clicks" Mobility  Outcome Measure Help  needed turning from your back to your side while in a flat bed without using bedrails?: Total Help needed moving from lying on your back to sitting on the side of a flat bed without using bedrails?: Total Help needed moving to and from a bed to a chair (including a wheelchair)?: Total Help needed standing up from a chair using your arms (e.g., wheelchair or bedside chair)?: Total Help needed to walk in hospital room?: Total Help needed climbing 3-5 steps with a railing? : Total 6 Click Score: 6    End of Session   Activity Tolerance: Patient limited by fatigue Patient left: in bed;with call bell/phone within reach;with bed alarm set   PT Visit Diagnosis: Muscle weakness (generalized) (M62.81);Other abnormalities of gait and mobility (R26.89)    Time: 5027-7412 PT Time Calculation (min) (ACUTE ONLY): 11 min   Charges:   PT Evaluation $PT Eval Low Complexity: 1 Low     Zenovia Jarred, PT, DPT Acute Rehabilitation Services Office: [phone] Pager: [phone]  Sarajane Jews 07/10/2019, 12:14 PM

## 2019-07-10 NOTE — Progress Notes (Signed)
OT Cancellation Note  Patient Details Name: Alicia Benitez MRN: [id number] DOB: [birth year]   Cancelled Treatment:    Reason Eval/Treat Not Completed: Other (comment).  Per notes, pt is from memory care with advanced dementia.  Will sign off.  Jasim Harari 07/10/2019, 7:42 AM  Lesle Chris, OTR/L Acute Rehabilitation Services [phone] [facility] pager [phone] office 07/10/2019

## 2019-07-10 NOTE — Progress Notes (Addendum)
PROGRESS NOTE    Alicia Benitez  WUJ:[id number] DOB: 03/22/1936 DOA: 20-Mar-2019 PCP: Margit HanksAlexander, Anne D, MD   Brief Narrative: Alicia Benitez is a 83 y.o. female with medical history significant for advanced dementia from memory care unit, hypothyroidism, hypertension, anxiety   Assessment & Plan:   Principal Problem:   Diarrhea with dehydration Active Problems:   Hypertension   Hypothyroidism   Depression, major, single episode, in partial remission (HCC)   Dementia with behavioral disturbance (HCC)   Pressure injury of skin   Diarrhea Appears to be resolved. In setting of fecal impaction, however. -Continue bowel regimen  Dehydration Associated hypotension and tachycardia. Secondary to above. Poor oral intake likely secondary to patient's underlying dementia -Continue IV hydration and give a 1L NS bolus  Fever Per chart history at memory care unit. Afebrile since admission. -Follow-up blood culture  AKI Peak creatinine of 1.77. Improved to 1.05 with IV fluids hydration  Hyperkalemia Hypokalemia Patient initially with hyperkalemia on presentation and subsequently developed hypokalemia. Hyperkalemia likely secondary to AKI -KDUR -BMP in AM  Essential hypertension Well controlled and slightly hypotensive at times -Hold antihypertensive meds  Hypothyroidism -Continue Synthroid  GERD -Continue Pepcid  Depression -Continue Paxil, Remeron, Depakote  Dementia Oriented to person only.  Pressure injury Left hip, POA   DVT prophylaxis: Heparin Code Status:   Code Status: Full Code Family Communication: Called daughter on listed number twice with no response/voicemail Disposition Plan: Discharge back to memory care unit pending IV hydration, bowel movement, toleration of oral intake   Consultants:   None  Procedures:   None  Antimicrobials:  None    Subjective: No concerns. No pain.  Objective: Vitals:   Aug 28, 2019 2244 07/10/19 0338  07/10/19 0559 07/10/19 1014  BP: (!) 96/50  125/64 (!) 98/49  Pulse: 89  86 (!) 130  Resp: 16  16   Temp: 99.9 F (37.7 C) 99.1 F (37.3 C) 98.4 F (36.9 C)   TempSrc: Axillary Axillary Oral   SpO2: 90%  98%   Weight:      Height:        Intake/Output Summary (Last 24 hours) at 07/10/2019 1224 Last data filed at 20-Mar-2019 2300 Gross per 24 hour  Intake 1794.11 ml  Output -  Net 1794.11 ml   Filed Weights   Aug 28, 2019 1311  Weight: 49 kg    Examination:  General exam: Appears calm and comfortable. Thin.  Respiratory system: Clear to auscultation. Respiratory effort normal. Cardiovascular system: S1 & S2 heard, RRR. No murmurs, rubs, gallops or clicks. Gastrointestinal system: Abdomen is nondistended, soft and nontender. No organomegaly or masses felt. Normal bowel sounds heard. Central nervous system: Alert and oriented to person only. States she is 83 years old. Extremities: No edema. No calf tenderness Skin: No cyanosis. No rashes Psychiatry: Judgement and insight appear impaired. Mood & affect appropriate.     Data Reviewed: I have personally reviewed following labs and imaging studies  CBC: Recent Labs  Lab Aug 28, 2019 0500 07/10/19 0343  WBC 12.7* 6.4  NEUTROABS 9.4*  --   HGB 11.7* 8.9*  HCT 37.5 28.3*  MCV 96.2 94.0  PLT 258 203   Basic Metabolic Panel: Recent Labs  Lab Aug 28, 2019 0611 Aug 28, 2019 0933 07/10/19 0343  NA 136 140 139  K 6.5* 4.5 2.7*  CL 106 110 112*  CO2 19* 22 18*  GLUCOSE 91 61* 96  BUN 74* 67* 48*  CREATININE 1.77* 1.59* 1.05*  CALCIUM 8.3* 7.6* 7.4*   GFR: Estimated Creatinine  Clearance: 32.5 mL/min (A) (by C-G formula based on SCr of 1.05 mg/dL (H)). Liver Function Tests: Recent Labs  Lab 07-18-2019 0611 07/10/19 0343  AST 14* 11*  ALT 6 7  ALKPHOS 63 50  BILITOT 0.5 0.7  PROT 5.5* 4.5*  ALBUMIN 2.6* 2.1*   No results for input(s): LIPASE, AMYLASE in the last 168 hours. No results for input(s): AMMONIA in the last 168  hours. Coagulation Profile: No results for input(s): INR, PROTIME in the last 168 hours. Cardiac Enzymes: No results for input(s): CKTOTAL, CKMB, CKMBINDEX, TROPONINI in the last 168 hours. BNP (last 3 results) No results for input(s): PROBNP in the last 8760 hours. HbA1C: No results for input(s): HGBA1C in the last 72 hours. CBG: Recent Labs  Lab 07-18-2019 0844 18-Jul-2019 1210 Jul 18, 2019 1631 07/10/19 0056 07/10/19 0824  GLUCAP 158* 59* 75 91 105*   Lipid Profile: No results for input(s): CHOL, HDL, LDLCALC, TRIG, CHOLHDL, LDLDIRECT in the last 72 hours. Thyroid Function Tests: Recent Labs    07/18/19 0933  TSH 5.570*  FREET4 1.13*   Anemia Panel: No results for input(s): VITAMINB12, FOLATE, FERRITIN, TIBC, IRON, RETICCTPCT in the last 72 hours. Sepsis Labs: No results for input(s): PROCALCITON, LATICACIDVEN in the last 168 hours.  Recent Results (from the past 240 hour(s))  SARS CORONAVIRUS 2 (TAT 6-24 HRS) Nasopharyngeal Nasopharyngeal Swab     Status: None   Collection Time: Jul 18, 2019  6:07 AM   Specimen: Nasopharyngeal Swab  Result Value Ref Range Status   SARS Coronavirus 2 NEGATIVE NEGATIVE Final    Comment: (NOTE) SARS-CoV-2 target nucleic acids are NOT DETECTED. The SARS-CoV-2 RNA is generally detectable in upper and lower respiratory specimens during the acute phase of infection. Negative results do not preclude SARS-CoV-2 infection, do not rule out co-infections with other pathogens, and should not be used as the sole basis for treatment or other patient management decisions. Negative results must be combined with clinical observations, patient history, and epidemiological information. The expected result is Negative. Fact Sheet for Patients: HairSlick.no Fact Sheet for Healthcare Providers: quierodirigir.com This test is not yet approved or cleared by the [place] FDA and  has been authorized for  detection and/or diagnosis of SARS-CoV-2 by FDA under an Emergency Use Authorization (EUA). This EUA will remain  in effect (meaning this test can be used) for the duration of the COVID-19 declaration under Section 56 4(b)(1) of the Act, 21 U.S.C. section 360bbb-3(b)(1), unless the authorization is terminated or revoked sooner. Performed at [facility], [address], [place], [place] [address]   Culture, blood (routine x 2)     Status: None (Preliminary result)   Collection Time: 2019/07/18  9:37 AM   Specimen: BLOOD  Result Value Ref Range Status   Specimen Description   Final    BLOOD LEFT ANTECUBITAL Performed at [facility], [address], [place], [place] [address]    Special Requests   Final    BOTTLES DRAWN AEROBIC AND ANAEROBIC Blood Culture adequate volume Performed at [facility], [address] [place] [address] [place], [place] [address]    Culture   Final    NO GROWTH < 24 HOURS Performed at [facility], [address] [place], [place] [address]    Report Status PENDING  Incomplete  Culture, blood (routine x 2)     Status: None (Preliminary result)   Collection Time: 18-Jul-2019  1:52 PM   Specimen: BLOOD LEFT HAND  Result Value  Ref Range Status   Specimen Description   Final    BLOOD LEFT HAND Performed at [facility] [address], [place], [place] [address]    Special Requests   Final    BOTTLES DRAWN AEROBIC ONLY Blood Culture results may not be optimal due to an inadequate volume of blood received in culture bottles Performed at [facility] [address] [place], [place] [address]    Culture   Final    NO GROWTH < 24 HOURS Performed at [facility] [address] [place], [place] [address]    Report Status PENDING  Incomplete  MRSA PCR Screening     Status: None   Collection Time: 07-19-2019  1:56 PM   Specimen: Nasal Mucosa; Nasopharyngeal  Result Value Ref Range  Status   MRSA by PCR NEGATIVE NEGATIVE Final    Comment:        The GeneXpert MRSA Assay (FDA approved for NASAL specimens only), is one component of a comprehensive MRSA colonization surveillance program. It is not intended to diagnose MRSA infection nor to guide or monitor treatment for MRSA infections. Performed at [facility], [place] [address] [place], [place] [address]          Radiology Studies: Ct Abdomen Pelvis Wo Contrast  Result Date: Jul 19, 2019 CLINICAL DATA:  Abdominal pain EXAM: CT ABDOMEN AND PELVIS WITHOUT CONTRAST TECHNIQUE: Multidetector CT imaging of the abdomen and pelvis was performed following the standard protocol without IV contrast. COMPARISON:  11/08/2018 FINDINGS: Lower chest: No acute abnormality. Hepatobiliary: No focal hepatic abnormality. Gallbladder unremarkable. Pancreas: No focal abnormality or ductal dilatation. Spleen: No focal abnormality.  Normal size. Adrenals/Urinary Tract: No visible focal renal or adrenal mass. No hydronephrosis. Urinary bladder unremarkable. Stomach/Bowel: Marked distention of the rectosigmoid colon with large stool burden concerning for fecal impaction. Stomach and small bowel decompressed, grossly unremarkable. Vascular/Lymphatic: Aortic atherosclerosis. No enlarged abdominal or pelvic lymph nodes. Reproductive: Prior hysterectomy.  No adnexal masses. Other: No free fluid or free air. Musculoskeletal: Chronic L1 compression fracture. No acute bony abnormality. IMPRESSION: Large stool burden and distention of the rectosigmoid colon concerning for fecal impaction. Otherwise no acute findings in the abdomen or pelvis. Aortic atherosclerosis. Electronically Signed   By: Rolm Baptise M.D.   On: 07/19/19 08:54   Dg Chest Port 1 View  Result Date: 2019-07-19 CLINICAL DATA:  Leukocytosis. EXAM: PORTABLE CHEST 1 VIEW COMPARISON:  Chest CT scan dated 11/08/2018 FINDINGS: The heart size and pulmonary vascularity are  normal. Aortic atherosclerosis. Multiple old healed right posterior rib fractures with adjacent pleural thickening as demonstrated on the prior CT scan. No infiltrates or effusions. No acute bone abnormality. IMPRESSION: 1. No acute abnormalities. 2. Aortic atherosclerosis. 3. Multiple old healed right posterior rib fractures with adjacent pleural thickening. Electronically Signed   By: Lorriane Shire M.D.   On: 19-Jul-2019 11:40        Scheduled Meds: . aspirin  81 mg Oral Daily  . divalproex  125 mg Oral QHS  . famotidine  10 mg Oral Daily  . feeding supplement (ENSURE ENLIVE)  237 mL Oral BID BM  . feeding supplement (PRO-STAT SUGAR FREE 64)  30 mL Oral BID  . heparin  5,000 Units Subcutaneous Q8H  . levothyroxine  88 mcg Oral QAC breakfast  . mouth rinse  15 mL Mouth Rinse BID  . mirtazapine  7.5 mg Oral QHS  . multivitamin with minerals  1 tablet Oral Daily  .  PARoxetine  10 mg Oral Daily  . polyethylene glycol  17 g Oral BID  . potassium chloride  40 mEq Oral Q6H  . senna-docusate  1 tablet Oral BID   Continuous Infusions: . dextrose 5 % and 0.45% NaCl 75 mL/hr at 07/10/19 1111  . sodium chloride       LOS: 0 days     Jacquelin Hawking, MD [facility] 07/10/2019, 12:24 PM  If 7PM-7AM, please contact night-coverage [email or web address]

## 2019-07-11 DIAGNOSIS — F0391 Unspecified dementia with behavioral disturbance: Secondary | ICD-10-CM | POA: Diagnosis not present

## 2019-07-11 DIAGNOSIS — R197 Diarrhea, unspecified: Secondary | ICD-10-CM | POA: Diagnosis not present

## 2019-07-11 DIAGNOSIS — E039 Hypothyroidism, unspecified: Secondary | ICD-10-CM | POA: Diagnosis not present

## 2019-07-11 DIAGNOSIS — I10 Essential (primary) hypertension: Secondary | ICD-10-CM | POA: Diagnosis not present

## 2019-07-11 LAB — BASIC METABOLIC PANEL
Anion gap: 7 (ref 5–15)
BUN: 25 mg/dL — ABNORMAL HIGH (ref 8–23)
CO2: 20 mmol/L — ABNORMAL LOW (ref 22–32)
Calcium: 8.1 mg/dL — ABNORMAL LOW (ref 8.9–10.3)
Chloride: 115 mmol/L — ABNORMAL HIGH (ref 98–111)
Creatinine, Ser: 0.67 mg/dL (ref 0.44–1.00)
GFR calc Af Amer: >60 mL/min (ref >60)
GFR calc non Af Amer: >60 mL/min (ref >60)
Glucose, Bld: 93 mg/dL (ref 70–99)
Potassium: 3.6 mmol/L (ref 3.5–5.1)
Sodium: 142 mmol/L (ref 135–145)

## 2019-07-11 LAB — GLUCOSE, CAPILLARY
Glucose-Capillary: 108 mg/dL — ABNORMAL HIGH (ref 70–99)
Glucose-Capillary: 83 mg/dL (ref 70–99)
Glucose-Capillary: 84 mg/dL (ref 70–99)

## 2019-07-11 LAB — CBC
HCT: 28 % — ABNORMAL LOW (ref 36.0–46.0)
Hemoglobin: 8.8 g/dL — ABNORMAL LOW (ref 12.0–15.0)
MCH: 30 pg (ref 26.0–34.0)
MCHC: 31.4 g/dL (ref 30.0–36.0)
MCV: 95.6 fL (ref 80.0–100.0)
Platelets: 174 10*3/uL (ref 150–400)
RBC: 2.93 MIL/uL — ABNORMAL LOW (ref 3.87–5.11)
RDW: 15.2 % (ref 11.5–15.5)
WBC: 7.3 10*3/uL (ref 4.0–10.5)
nRBC: 0 % (ref 0.0–0.2)

## 2019-07-11 MED ORDER — BISACODYL 10 MG RE SUPP
10.0000 mg | Freq: Once | RECTAL | Status: AC
  Administered 2019-07-11: 10 mg via RECTAL
  Filled 2019-07-11: qty 1

## 2019-07-11 MED ORDER — METOPROLOL TARTRATE 5 MG/5ML IV SOLN
5.0000 mg | INTRAVENOUS | Status: AC | PRN
  Administered 2019-07-12 (×2): 5 mg via INTRAVENOUS
  Filled 2019-07-11 (×2): qty 5

## 2019-07-11 NOTE — Progress Notes (Signed)
Call from tele that pts HR was 160's, pt was being bathed and turned by tech. Will continue to monitor.

## 2019-07-11 NOTE — Plan of Care (Signed)
  Problem: Education: Goal: Knowledge of General Education information will improve Description: Including pain rating scale, medication(s)/side effects and non-pharmacologic comfort measures Outcome: Progressing   Problem: Education: Goal: Knowledge of General Education information will improve Description: Including pain rating scale, medication(s)/side effects and non-pharmacologic comfort measures 07/11/2019 1243 by Zadie Rhine, RN Outcome: Adequate for Discharge 07/11/2019 1243 by Zadie Rhine, RN Outcome: Progressing 07/11/2019 1243 by Zadie Rhine, RN Outcome: Progressing

## 2019-07-11 NOTE — Progress Notes (Signed)
Pt coughed after oral intake, MD updated, swallowing evaluation ordered. SRP,RN

## 2019-07-11 NOTE — Progress Notes (Signed)
PROGRESS NOTE    Alicia Benitez  JYN:[id number] DOB: [birth year] DOA: July 16, 2019 PCP: Hennie Duos, MD   Brief Narrative: Alicia Benitez is a 82 y.o. female with medical history significant for advanced dementia from memory care unit, hypothyroidism, hypertension, anxiety. Patient presented from memory care unit secondary to diarrhea and found to have fecal impaction   Assessment & Plan:   Principal Problem:   Diarrhea with dehydration Active Problems:   Hypertension   Hypothyroidism   Depression, major, single episode, in partial remission ([place])   Dementia with behavioral disturbance (HCC)   Pressure injury of skin   Diarrhea Appears to be resolved. In setting of fecal impaction, however. -Continue bowel regimen -Dulcolax suppository  Dehydration Associated hypotension and tachycardia. Secondary to above. Poor oral intake likely secondary to patient's underlying dementia -Continue IV hydration  Fever Per chart history at memory care unit. Afebrile since admission. Patient high grade temperature today but no fever. -Follow-up blood culture -Obtain urine culture  AKI Peak creatinine of 1.77. Resolved with IV fluids hydration  Hyperkalemia Hypokalemia Patient initially with hyperkalemia on presentation and subsequently developed hypokalemia. Hyperkalemia likely secondary to AKI. Resolved.  Essential hypertension Well controlled and slightly hypotensive at times -Hold antihypertensive meds  Hypothyroidism -Continue Synthroid  GERD -Continue Pepcid  Depression -Continue Paxil, Remeron, Depakote  Dementia Oriented to person only.  Pressure injury Left hip, POA   DVT prophylaxis: Heparin Code Status:   Code Status: Full Code Family Communication: Daughter on telephone Disposition Plan: Discharge back to memory care unit pending IV hydration, bowel movement, toleration of oral intake   Consultants:   None  Procedures:   None  Antimicrobials:   None    Subjective: No pain  Objective: Vitals:   07/10/19 1308 07/10/19 1903 07/10/19 2029 07/11/19 0442  BP: (!) 103/59 97/69 103/62 120/67  Pulse: (!) 107 (!) 135 (!) 136 (!) 114  Resp:   16 17  Temp: (!) 97.5 F (36.4 C)  100.2 F (37.9 C) 100.3 F (37.9 C)  TempSrc:   Oral Oral  SpO2: 95%  100% 96%  Weight:      Height:        Intake/Output Summary (Last 24 hours) at 07/11/2019 1202 Last data filed at 07/11/2019 0951 Gross per 24 hour  Intake 4017.33 ml  Output -  Net 4017.33 ml   Filed Weights   07-16-2019 1311  Weight: 49 kg    Examination:  General exam: Appears calm and comfortable Respiratory system: Clear to auscultation. Respiratory effort normal. Cardiovascular system: S1 & S2 heard, RRR. No murmurs, rubs, gallops or clicks. Gastrointestinal system: Abdomen is nondistended, soft and nontender. No organomegaly or masses felt. Normal bowel sounds heard. Central nervous system: Alert and oriented to self. No focal neurological deficits. Extremities: No edema. No calf tenderness Skin: No cyanosis. No rashes Psychiatry: Judgement and insight appear imipaired.    Data Reviewed: I have personally reviewed following labs and imaging studies  CBC: Recent Labs  Lab 07/16/2019 0500 07/10/19 0343 07/11/19 0912  WBC 12.7* 6.4 7.3  NEUTROABS 9.4*  --   --   HGB 11.7* 8.9* 8.8*  HCT 37.5 28.3* 28.0*  MCV 96.2 94.0 95.6  PLT 258 203 865   Basic Metabolic Panel: Recent Labs  Lab Jul 16, 2019 0611 Jul 16, 2019 0933 07/10/19 0343 07/11/19 0912  NA 136 140 139 142  K 6.5* 4.5 2.7* 3.6  CL 106 110 112* 115*  CO2 19* 22 18* 20*  GLUCOSE 91 61* 96 93  BUN 74* 67* 48* 25*  CREATININE 1.77* 1.59* 1.05* 0.67  CALCIUM 8.3* 7.6* 7.4* 8.1*   GFR: Estimated Creatinine Clearance: 42.7 mL/min (by C-G formula based on SCr of 0.67 mg/dL). Liver Function Tests: Recent Labs  Lab 06/29/2019 0611 07/10/19 0343  AST 14* 11*  ALT 6 7  ALKPHOS 63 50  BILITOT 0.5 0.7   PROT 5.5* 4.5*  ALBUMIN 2.6* 2.1*   No results for input(s): LIPASE, AMYLASE in the last 168 hours. No results for input(s): AMMONIA in the last 168 hours. Coagulation Profile: No results for input(s): INR, PROTIME in the last 168 hours. Cardiac Enzymes: No results for input(s): CKTOTAL, CKMB, CKMBINDEX, TROPONINI in the last 168 hours. BNP (last 3 results) No results for input(s): PROBNP in the last 8760 hours. HbA1C: No results for input(s): HGBA1C in the last 72 hours. CBG: Recent Labs  Lab 07/10/19 0056 07/10/19 0824 07/10/19 1635 07/11/19 0032 07/11/19 0736  GLUCAP 91 105* 106* 84 83   Lipid Profile: No results for input(s): CHOL, HDL, LDLCALC, TRIG, CHOLHDL, LDLDIRECT in the last 72 hours. Thyroid Function Tests: Recent Labs    07/12/2019 0933  TSH 5.570*  FREET4 1.13*   Anemia Panel: No results for input(s): VITAMINB12, FOLATE, FERRITIN, TIBC, IRON, RETICCTPCT in the last 72 hours. Sepsis Labs: No results for input(s): PROCALCITON, LATICACIDVEN in the last 168 hours.  Recent Results (from the past 240 hour(s))  SARS CORONAVIRUS 2 (TAT 6-24 HRS) Nasopharyngeal Nasopharyngeal Swab     Status: None   Collection Time: 07/14/2019  6:07 AM   Specimen: Nasopharyngeal Swab  Result Value Ref Range Status   SARS Coronavirus 2 NEGATIVE NEGATIVE Final    Comment: (NOTE) SARS-CoV-2 target nucleic acids are NOT DETECTED. The SARS-CoV-2 RNA is generally detectable in upper and lower respiratory specimens during the acute phase of infection. Negative results do not preclude SARS-CoV-2 infection, do not rule out co-infections with other pathogens, and should not be used as the sole basis for treatment or other patient management decisions. Negative results must be combined with clinical observations, patient history, and epidemiological information. The expected result is Negative. Fact Sheet for Patients: HairSlick.nohttps://www.fda.gov/media/138098/download Fact Sheet for Healthcare  Providers: quierodirigir.comhttps://www.fda.gov/media/138095/download This test is not yet approved or cleared by the [place] FDA and  has been authorized for detection and/or diagnosis of SARS-CoV-2 by FDA under an Emergency Use Authorization (EUA). This EUA will remain  in effect (meaning this test can be used) for the duration of the COVID-19 declaration under Section 56 4(b)(1) of the Act, 21 U.S.C. section 360bbb-3(b)(1), unless the authorization is terminated or revoked sooner. Performed at [facility], [address] [place], [place] [address]   Culture, blood (routine x 2)     Status: None (Preliminary result)   Collection Time: 06/28/2019  9:37 AM   Specimen: BLOOD  Result Value Ref Range Status   Specimen Description   Final    BLOOD LEFT ANTECUBITAL Performed at [facility], [address] [place], [place] [address]    Special Requests   Final    BOTTLES DRAWN AEROBIC AND ANAEROBIC Blood Culture adequate volume Performed at [facility], [address] [place], [place] [address]    Culture   Final    NO GROWTH 2 DAYS Performed at [facility], [address] [place], [place] [address]    Report Status PENDING  Incomplete  Culture, blood (routine x 2)     Status: None (  Preliminary result)   Collection Time: 07-22-2019  1:52 PM   Specimen: BLOOD LEFT HAND  Result Value Ref Range Status   Specimen Description   Final    BLOOD LEFT HAND Performed at [facility], [address], [place], [place] [address]    Special Requests   Final    BOTTLES DRAWN AEROBIC ONLY Blood Culture results may not be optimal due to an inadequate volume of blood received in culture bottles Performed at [facility], [address], [place], [place] [address]    Culture   Final    NO GROWTH 2 DAYS Performed at [facility], [address], [place], [place] [address]    Report Status PENDING  Incomplete   MRSA PCR Screening     Status: None   Collection Time: Jul 22, 2019  1:56 PM   Specimen: Nasal Mucosa; Nasopharyngeal  Result Value Ref Range Status   MRSA by PCR NEGATIVE NEGATIVE Final    Comment:        The GeneXpert MRSA Assay (FDA approved for NASAL specimens only), is one component of a comprehensive MRSA colonization surveillance program. It is not intended to diagnose MRSA infection nor to guide or monitor treatment for MRSA infections. Performed at [facility], [address] [place], [place] [address]          Radiology Studies: No results found.      Scheduled Meds: . aspirin  81 mg Oral Daily  . bisacodyl  10 mg Rectal Once  . divalproex  125 mg Oral QHS  . famotidine  10 mg Oral Daily  . feeding supplement (ENSURE ENLIVE)  237 mL Oral BID BM  . feeding supplement (PRO-STAT SUGAR FREE 64)  30 mL Oral BID  . heparin  5,000 Units Subcutaneous Q8H  . levothyroxine  88 mcg Oral QAC breakfast  . mouth rinse  15 mL Mouth Rinse BID  . mirtazapine  7.5 mg Oral QHS  . multivitamin with minerals  1 tablet Oral Daily  . PARoxetine  10 mg Oral Daily  . polyethylene glycol  17 g Oral BID  . senna-docusate  1 tablet Oral BID   Continuous Infusions: . dextrose 5 % and 0.45% NaCl 75 mL/hr at 07/11/19 0904     LOS: 1 day     Jacquelin Hawking, MD [facility] 07/11/2019, 12:02 PM  If 7PM-7AM, please contact night-coverage [email or web address]

## 2019-07-12 DIAGNOSIS — F0391 Unspecified dementia with behavioral disturbance: Secondary | ICD-10-CM | POA: Diagnosis not present

## 2019-07-12 DIAGNOSIS — I10 Essential (primary) hypertension: Secondary | ICD-10-CM | POA: Diagnosis not present

## 2019-07-12 DIAGNOSIS — E039 Hypothyroidism, unspecified: Secondary | ICD-10-CM | POA: Diagnosis not present

## 2019-07-12 DIAGNOSIS — R197 Diarrhea, unspecified: Secondary | ICD-10-CM | POA: Diagnosis not present

## 2019-07-12 LAB — GLUCOSE, CAPILLARY
Glucose-Capillary: 82 mg/dL (ref 70–99)
Glucose-Capillary: 116 mg/dL — ABNORMAL HIGH (ref 70–99)
Glucose-Capillary: 112 mg/dL — ABNORMAL HIGH (ref 70–99)

## 2019-07-12 MED ORDER — SODIUM CHLORIDE 0.9 % IV SOLN
1.0000 g | INTRAVENOUS | Status: AC
  Administered 2019-07-12 – 2019-07-13 (×2): 1 g via INTRAVENOUS
  Filled 2019-07-12 (×2): qty 1

## 2019-07-12 NOTE — Progress Notes (Signed)
Patient's heart rate is Tachycardic, sustaining in 120s and has been in SVT. Metoprolol 5mg  IV push was administered to patient.

## 2019-07-12 NOTE — Progress Notes (Signed)
PROGRESS NOTE    Alicia Benitez  CXK:[id number] DOB: December 18, 1935 DOA: 07/02/2019 PCP: Margit Hanks, MD   Brief Narrative: Alicia Benitez is a 83 y.o. female with medical history significant for advanced dementia from memory care unit, hypothyroidism, hypertension, anxiety. Patient presented from memory care unit secondary to diarrhea and found to have fecal impaction   Assessment & Plan:   Principal Problem:   Diarrhea with dehydration Active Problems:   Hypertension   Hypothyroidism   Depression, major, single episode, in partial remission (HCC)   Dementia with behavioral disturbance (HCC)   Pressure injury of skin   Diarrhea Appears to be resolved. In setting of fecal impaction, however. -Continue bowel regimen -Dulcolax suppository  Dehydration Associated hypotension and tachycardia. Secondary to above. Poor oral intake likely secondary to patient's underlying dementia -Continue IV hydration  UTI Likely source of patient's fevers. E. Coli on urine culture. Sensitivities pending. Blood cultures no growth to date. Afebrile. -Ceftriaxone -Await final culture data  AKI Peak creatinine of 1.77. Resolved with IV fluids hydration  Tachycardia Intermittent. Sinus. Given IV metoprolol overnight. Better controlled today  Hyperkalemia Hypokalemia Patient initially with hyperkalemia on presentation and subsequently developed hypokalemia. Hyperkalemia likely secondary to AKI. Resolved.  Essential hypertension Well controlled and slightly hypotensive at times -Hold antihypertensive meds  Hypothyroidism -Continue Synthroid  GERD -Continue Pepcid  Depression -Continue Paxil, Remeron, Depakote  Dementia Oriented to person only.  Pressure injury Left hip, POA   DVT prophylaxis: Heparin Code Status:   Code Status: Full Code Family Communication: Called daughter on telephone without response Disposition Plan: Discharge back to memory care unit pending IV  hydration, bowel movement, toleration of oral intake in addition to culture data   Consultants:   None  Procedures:   None  Antimicrobials:  Ceftriaxone IV (10/17>>   Subjective: No concerns. No dysuria.  Objective: Vitals:   07/11/19 2342 07/12/19 0005 07/12/19 0010 07/12/19 0538  BP: (!) 146/73   (!) 163/92  Pulse: (!) 129 (!) 129 85 96  Resp: 20   19  Temp:    98.8 F (37.1 C)  TempSrc:    Oral  SpO2: 100%   99%  Weight:      Height:        Intake/Output Summary (Last 24 hours) at 07/12/2019 1104 Last data filed at 07/12/2019 1100 Gross per 24 hour  Intake 2081.89 ml  Output 1700 ml  Net 381.89 ml   Filed Weights   07/06/2019 1311  Weight: 49 kg    Examination:  General exam: Appears calm and comfortable Respiratory system: Clear to auscultation. Respiratory effort normal. Cardiovascular system: S1 & S2 heard, RRR. No murmurs, rubs, gallops or clicks. Gastrointestinal system: Abdomen is nondistended, soft and nontender. No organomegaly or masses felt. Normal bowel sounds heard. Central nervous system: Alert and oriented to person only. Extremities: No edema. No calf tenderness Skin: No cyanosis. No rashes Psychiatry: Judgement and insight appear impaired.    Data Reviewed: I have personally reviewed following labs and imaging studies  CBC: Recent Labs  Lab 07/02/2019 0500 07/10/19 0343 07/11/19 0912  WBC 12.7* 6.4 7.3  NEUTROABS 9.4*  --   --   HGB 11.7* 8.9* 8.8*  HCT 37.5 28.3* 28.0*  MCV 96.2 94.0 95.6  PLT 258 203 174   Basic Metabolic Panel: Recent Labs  Lab 07/08/2019 0611 07/15/2019 0933 07/10/19 0343 07/11/19 0912  NA 136 140 139 142  K 6.5* 4.5 2.7* 3.6  CL 106 110 112* 115*  CO2 19* 22 18* 20*  GLUCOSE 91 61* 96 93  BUN 74* 67* 48* 25*  CREATININE 1.77* 1.59* 1.05* 0.67  CALCIUM 8.3* 7.6* 7.4* 8.1*   GFR: Estimated Creatinine Clearance: 42.7 mL/min (by C-G formula based on SCr of 0.67 mg/dL). Liver Function Tests: Recent  Labs  Lab 2019-07-17 0611 07/10/19 0343  AST 14* 11*  ALT 6 7  ALKPHOS 63 50  BILITOT 0.5 0.7  PROT 5.5* 4.5*  ALBUMIN 2.6* 2.1*   No results for input(s): LIPASE, AMYLASE in the last 168 hours. No results for input(s): AMMONIA in the last 168 hours. Coagulation Profile: No results for input(s): INR, PROTIME in the last 168 hours. Cardiac Enzymes: No results for input(s): CKTOTAL, CKMB, CKMBINDEX, TROPONINI in the last 168 hours. BNP (last 3 results) No results for input(s): PROBNP in the last 8760 hours. HbA1C: No results for input(s): HGBA1C in the last 72 hours. CBG: Recent Labs  Lab 07/10/19 1635 07/11/19 0032 07/11/19 0736 07/11/19 1956 07/12/19 0539  GLUCAP 106* 84 83 108* 82   Lipid Profile: No results for input(s): CHOL, HDL, LDLCALC, TRIG, CHOLHDL, LDLDIRECT in the last 72 hours. Thyroid Function Tests: No results for input(s): TSH, T4TOTAL, FREET4, T3FREE, THYROIDAB in the last 72 hours. Anemia Panel: No results for input(s): VITAMINB12, FOLATE, FERRITIN, TIBC, IRON, RETICCTPCT in the last 72 hours. Sepsis Labs: No results for input(s): PROCALCITON, LATICACIDVEN in the last 168 hours.  Recent Results (from the past 240 hour(s))  SARS CORONAVIRUS 2 (TAT 6-24 HRS) Nasopharyngeal Nasopharyngeal Swab     Status: None   Collection Time: 07-17-19  6:07 AM   Specimen: Nasopharyngeal Swab  Result Value Ref Range Status   SARS Coronavirus 2 NEGATIVE NEGATIVE Final    Comment: (NOTE) SARS-CoV-2 target nucleic acids are NOT DETECTED. The SARS-CoV-2 RNA is generally detectable in upper and lower respiratory specimens during the acute phase of infection. Negative results do not preclude SARS-CoV-2 infection, do not rule out co-infections with other pathogens, and should not be used as the sole basis for treatment or other patient management decisions. Negative results must be combined with clinical observations, patient history, and epidemiological information. The  expected result is Negative. Fact Sheet for Patients: SugarRoll.be Fact Sheet for Healthcare Providers: https://www.woods-mathews.com/ This test is not yet approved or cleared by the [place] FDA and  has been authorized for detection and/or diagnosis of SARS-CoV-2 by FDA under an Emergency Use Authorization (EUA). This EUA will remain  in effect (meaning this test can be used) for the duration of the COVID-19 declaration under Section 56 4(b)(1) of the Act, 21 U.S.C. section 360bbb-3(b)(1), unless the authorization is terminated or revoked sooner. Performed at [facility], [place] [address] [place], [place] [address]   Culture, blood (routine x 2)     Status: None (Preliminary result)   Collection Time: 07/17/19  9:37 AM   Specimen: BLOOD  Result Value Ref Range Status   Specimen Description   Final    BLOOD LEFT ANTECUBITAL Performed at [place] [address] [place], [place] [address]    Special Requests   Final    BOTTLES DRAWN AEROBIC AND ANAEROBIC Blood Culture adequate volume Performed at [place] [address] [place], [place] [address]    Culture   Final    NO GROWTH 3 DAYS Performed at [facility], [place] [address], [place], [place] [address]    Report Status PENDING  Incomplete  Culture, blood (routine x 2)     Status: None (Preliminary result)   Collection Time: 18-Apr-2019  1:52 PM   Specimen: BLOOD LEFT HAND  Result Value Ref Range Status   Specimen Description   Final    BLOOD LEFT HAND Performed at [facility], [address] [place], [place] [address]    Special Requests   Final    BOTTLES DRAWN AEROBIC ONLY Blood Culture results may not be optimal due to an inadequate volume of blood received in culture bottles Performed at [facility], [address] [place], [place] [address]    Culture   Final    NO GROWTH  3 DAYS Performed at [facility], [address] [place], [place] [address]    Report Status PENDING  Incomplete  MRSA PCR Screening     Status: None   Collection Time: 18-Apr-2019  1:56 PM   Specimen: Nasal Mucosa; Nasopharyngeal  Result Value Ref Range Status   MRSA by PCR NEGATIVE NEGATIVE Final    Comment:        The GeneXpert MRSA Assay (FDA approved for NASAL specimens only), is one component of a comprehensive MRSA colonization surveillance program. It is not intended to diagnose MRSA infection nor to guide or monitor treatment for MRSA infections. Performed at [facility], [address] [place], [place] [address]   Culture, Urine     Status: Abnormal (Preliminary result)   Collection Time: 07/11/19  9:44 AM   Specimen: Urine, Catheterized  Result Value Ref Range Status   Specimen Description   Final    URINE, CATHETERIZED Performed at [facility], [address] [place], [place] [address]    Special Requests   Final    NONE Performed at [facility], [address] [place], [place] [address]    Culture >=100,000 COLONIES/mL GRAM NEGATIVE RODS (A)  Final   Report Status PENDING  Incomplete         Radiology Studies: No results found.      Scheduled Meds: . aspirin  81 mg Oral Daily  . divalproex  125 mg Oral QHS  . famotidine  10 mg Oral Daily  . feeding supplement (ENSURE ENLIVE)  237 mL Oral BID BM  . feeding supplement (PRO-STAT SUGAR FREE 64)  30 mL Oral BID  . heparin  5,000 Units Subcutaneous Q8H  . levothyroxine  88 mcg Oral QAC breakfast  . mouth rinse  15 mL Mouth Rinse BID  . mirtazapine  7.5 mg Oral QHS  . multivitamin with minerals  1 tablet Oral Daily  . PARoxetine  10 mg Oral Daily  . polyethylene glycol  17 g Oral BID  . senna-docusate  1 tablet Oral BID   Continuous Infusions: . cefTRIAXone (ROCEPHIN)  IV    . dextrose 5 % and 0.45% NaCl 75 mL/hr at 07/12/19 0933      LOS: 2 days     Jacquelin Hawkingalph Heavan Francom, MD [facility] 07/12/2019, 11:04 AM  If 7PM-7AM, please contact night-coverage [email or web address]

## 2019-07-12 NOTE — Evaluation (Signed)
Clinical/Bedside Swallow Evaluation Patient Details  Name: Alicia Benitez MRN: [id number] Date of Birth: Feb 02, 1936  Today's Date: 07/12/2019 Time: SLP Start Time (ACUTE ONLY): 1030 SLP Stop Time (ACUTE ONLY): 1045 SLP Time Calculation (min) (ACUTE ONLY): 15 min  Past Medical History:  Past Medical History:  Diagnosis Date  . Anxiety   . Dementia (HCC)   . Hypercholesteremia   . Hypertension   . Osteoporosis   . Thyroid disease    Past Surgical History:  Past Surgical History:  Procedure Laterality Date  . ABDOMINAL HYSTERECTOMY    . APPENDECTOMY    . BREAST BIOPSY Left   . excision of meckel diverticulum    . IM Nailing Hip fracture    . salpingophorectomy     HPI:  83 y.o. female with medical history significant for advanced dementia from memory care unit, hypothyroidism, hypertension, anxiety was brought from memory care unit with complaints of reported fever and diarrhea   Assessment / Plan / Recommendation Clinical Impression  Patient presents with a mild oropharyngeal dysphagia without overt s/s of aspiration or penetration. Patient accepted small amounts of PO's of thin liquids (plain water) and puree solids, but declined regular or mechanical soft solids. She intermittently exhibited a mild swallow initiation delay with straw sips of thin liquids but no coughing, throat clearing, etc. She tolerated puree solids without any overt s/s of aspiration or penetration or swallowing difficulty. Main concern with this patient is her advanced dementia which is likely contributing to her poor PO intake. SLP Visit Diagnosis: Dysphagia, unspecified (R13.10)    Aspiration Risk  Mild aspiration risk    Diet Recommendation Dysphagia 3 (Mech soft);Thin liquid   Liquid Administration via: Cup;Straw Medication Administration: Crushed with puree Supervision: Staff to assist with self feeding;Full supervision/cueing for compensatory strategies Compensations: Minimize environmental  distractions;Slow rate;Small sips/bites Postural Changes: Seated upright at 90 degrees    Other  Recommendations Oral Care Recommendations: Oral care BID   Follow up Recommendations Skilled Nursing facility      Frequency and Duration min 1 x/week  1 week       Prognosis Prognosis for Safe Diet Advancement: Fair Barriers to Reach Goals: Cognitive deficits      Swallow Study   General Date of Onset: 07/11/2019 HPI: 83 y.o. female with medical history significant for advanced dementia from memory care unit, hypothyroidism, hypertension, anxiety was brought from memory care unit with complaints of reported fever and diarrhea Type of Study: Bedside Swallow Evaluation Previous Swallow Assessment: N/A Diet Prior to this Study: Dysphagia 3 (soft);Nectar-thick liquids Temperature Spikes Noted: No Respiratory Status: Room air History of Recent Intubation: No Behavior/Cognition: Alert;Lethargic/Drowsy;Cooperative;Pleasant mood;Requires cueing Oral Cavity Assessment: Within Functional Limits Oral Care Completed by SLP: Yes Oral Cavity - Dentition: Adequate natural dentition Self-Feeding Abilities: Total assist Patient Positioning: Upright in bed;Postural control adequate for testing Baseline Vocal Quality: Low vocal intensity;Normal Volitional Cough: Cognitively unable to elicit Volitional Swallow: Unable to elicit    Oral/Motor/Sensory Function Overall Oral Motor/Sensory Function: Within functional limits   Ice Chips     Thin Liquid Thin Liquid: Impaired Presentation: Straw Pharyngeal  Phase Impairments: Suspected delayed Swallow Other Comments: intermittently exhibited a mild swallow initiation delay, no overt s/s aspiration or penetration    Nectar Thick     Honey Thick     Puree Puree: Within functional limits   Solid     Solid: Not tested Other Comments: patient declined; RN informed SLP that patient didn't have any difficulties with chewing and swallowing  solids as of yet.       Nadara Mode Tarrell 07/12/2019,12:07 PM    Sonia Baller, MA, CCC-SLP Speech Therapy [facility]

## 2019-07-12 NOTE — Progress Notes (Signed)
Was notified by central telemetry that pts HR had jumped up to 174 non sustained. Checked pt, pt resting comfortably, not in any distress. Checked tele monitor, pts HR sustained in the 120's to 130's for more then 30 minutes, BP 146/73 O2 100% RA RR 20. Notified TRH Jeannette Corpus, NP. See new orders. Will continue to monitor pt.

## 2019-07-13 DIAGNOSIS — E039 Hypothyroidism, unspecified: Secondary | ICD-10-CM | POA: Diagnosis not present

## 2019-07-13 DIAGNOSIS — I10 Essential (primary) hypertension: Secondary | ICD-10-CM | POA: Diagnosis not present

## 2019-07-13 DIAGNOSIS — F0391 Unspecified dementia with behavioral disturbance: Secondary | ICD-10-CM | POA: Diagnosis not present

## 2019-07-13 DIAGNOSIS — R197 Diarrhea, unspecified: Secondary | ICD-10-CM | POA: Diagnosis not present

## 2019-07-13 LAB — BASIC METABOLIC PANEL
Anion gap: 8 (ref 5–15)
BUN: 13 mg/dL (ref 8–23)
CO2: 26 mmol/L (ref 22–32)
Calcium: 7.9 mg/dL — ABNORMAL LOW (ref 8.9–10.3)
Chloride: 105 mmol/L (ref 98–111)
Creatinine, Ser: 0.6 mg/dL (ref 0.44–1.00)
GFR calc Af Amer: >60 mL/min (ref >60)
GFR calc non Af Amer: >60 mL/min (ref >60)
Glucose, Bld: 118 mg/dL — ABNORMAL HIGH (ref 70–99)
Potassium: 3 mmol/L — ABNORMAL LOW (ref 3.5–5.1)
Sodium: 139 mmol/L (ref 135–145)

## 2019-07-13 LAB — CBC
HCT: 31.6 % — ABNORMAL LOW (ref 36.0–46.0)
Hemoglobin: 9.9 g/dL — ABNORMAL LOW (ref 12.0–15.0)
MCH: 29.5 pg (ref 26.0–34.0)
MCHC: 31.3 g/dL (ref 30.0–36.0)
MCV: 94 fL (ref 80.0–100.0)
Platelets: 232 10*3/uL (ref 150–400)
RBC: 3.36 MIL/uL — ABNORMAL LOW (ref 3.87–5.11)
RDW: 15.2 % (ref 11.5–15.5)
WBC: 6.7 10*3/uL (ref 4.0–10.5)
nRBC: 0 % (ref 0.0–0.2)

## 2019-07-13 LAB — URINE CULTURE: Culture: >=100000 — AB

## 2019-07-13 LAB — GLUCOSE, CAPILLARY
Glucose-Capillary: 90 mg/dL (ref 70–99)
Glucose-Capillary: 118 mg/dL — ABNORMAL HIGH (ref 70–99)
Glucose-Capillary: 94 mg/dL (ref 70–99)

## 2019-07-13 MED ORDER — AMOXICILLIN 250 MG PO CAPS
500.0000 mg | ORAL_CAPSULE | Freq: Three times a day (TID) | ORAL | Status: DC
  Administered 2019-07-14 – 2019-07-19 (×16): 500 mg via ORAL
  Filled 2019-07-13 (×17): qty 2

## 2019-07-13 NOTE — Progress Notes (Signed)
PROGRESS NOTE    Alicia Benitez  ZOX:[id number] DOB: Sep 24, 1936 DOA: 2019/08/08 PCP: Margit Hanks, MD   Brief Narrative: Alicia Benitez is a 83 y.o. female with medical history significant for advanced dementia from memory care unit, hypothyroidism, hypertension, anxiety. Patient presented from memory care unit secondary to diarrhea and found to have fecal impaction   Assessment & Plan:   Principal Problem:   Diarrhea with dehydration Active Problems:   Hypertension   Hypothyroidism   Depression, major, single episode, in partial remission (HCC)   Dementia with behavioral disturbance (HCC)   Pressure injury of skin   Diarrhea/Fecal impaction Diarrhea resolved and in setting of fecal impaction. Large bowel movement on 10/17. -Continue bowel regimen  Dehydration Associated hypotension and tachycardia. Secondary to above. Poor oral intake likely secondary to patient's underlying dementia -Continue IV hydration -If no improvement with treatment of medical illness, may require further goals of care discussions  UTI Likely source of patient's fevers. E. Coli on urine culture. Pansensitive. Blood cultures no growth to date. Afebrile. -Discontinue Ceftriaxone (last dose today), start amoxicillin (starting 10/19)  AKI Peak creatinine of 1.77. Resolved with IV fluids hydration  Tachycardia Intermittent. Sinus. Given IV metoprolol overnight. Better controlled today  Hyperkalemia Hypokalemia Patient initially with hyperkalemia on presentation and subsequently developed hypokalemia. Hyperkalemia likely secondary to AKI. Resolved.  Essential hypertension Well controlled and slightly hypotensive at times -Hold antihypertensive meds  Hypothyroidism -Continue Synthroid  GERD -Continue Pepcid  Depression -Continue Paxil, Remeron, Depakote  Dementia Oriented to person only.  Pressure injury Left hip, POA   DVT prophylaxis: Heparin Code Status:   Code Status: Full  Code Family Communication: Called daughter; no response Disposition Plan: Discharge back to memory care when bed available for transfer, improved oral intake   Consultants:   None  Procedures:   None  Antimicrobials:  Ceftriaxone IV (10/17>>10/18)  Amoxicillin PO (10/19>>   Subjective: No concerns today  Objective: Vitals:   07/12/19 0538 07/12/19 1326 07/12/19 2043 07/13/19 0443  BP: (!) 163/92 (!) 121/94 (!) 141/90 132/86  Pulse: 96 90 67 65  Resp: Temp: 98.8 F (37.1 C) 98.4 F (36.9 C) 98.7 F (37.1 C) 98.2 F (36.8 C)  TempSrc: Oral Axillary  Axillary  SpO2: 99% 100% 91% 93%  Weight:      Height:        Intake/Output Summary (Last 24 hours) at 07/13/2019 1153 Last data filed at 07/13/2019 0600 Gross per 24 hour  Intake 1930.58 ml  Output 900 ml  Net 1030.58 ml   Filed Weights   08-08-19 1311  Weight: 49 kg    Examination:  General exam: Appears calm and comfortable Respiratory system: Clear to auscultation. Respiratory effort normal. Cardiovascular system: S1 & S2 heard, RRR. No murmurs, rubs, gallops or clicks. Gastrointestinal system: Abdomen is nondistended, soft and nontender. No organomegaly or masses felt. Normal bowel sounds heard. Central nervous system: Alert and oriented to self. Pleasant.. Extremities: No edema. No calf tenderness Skin: No cyanosis. No rashes Psychiatry: Mood & affect appropriate.     Data Reviewed: I have personally reviewed following labs and imaging studies  CBC: Recent Labs  Lab 2019-08-08 0500 07/10/19 0343 07/11/19 0912  WBC 12.7* 6.4 7.3  NEUTROABS 9.4*  --   --   HGB 11.7* 8.9* 8.8*  HCT 37.5 28.3* 28.0*  MCV 96.2 94.0 95.6  PLT 258 203 174   Basic Metabolic Panel: Recent Labs  Lab 08-Aug-2019 0611 2019/08/08 0933 07/10/19  1610 07/11/19 0912  NA 136 140 139 142  K 6.5* 4.5 2.7* 3.6  CL 106 110 112* 115*  CO2 19* 22 18* 20*  GLUCOSE 91 61* 96 93  BUN 74* 67* 48* 25*  CREATININE  1.77* 1.59* 1.05* 0.67  CALCIUM 8.3* 7.6* 7.4* 8.1*   GFR: Estimated Creatinine Clearance: 42.7 mL/min (by C-G formula based on SCr of 0.67 mg/dL). Liver Function Tests: Recent Labs  Lab 07/16/2019 0611 07/10/19 0343  AST 14* 11*  ALT 6 7  ALKPHOS 63 50  BILITOT 0.5 0.7  PROT 5.5* 4.5*  ALBUMIN 2.6* 2.1*   No results for input(s): LIPASE, AMYLASE in the last 168 hours. No results for input(s): AMMONIA in the last 168 hours. Coagulation Profile: No results for input(s): INR, PROTIME in the last 168 hours. Cardiac Enzymes: No results for input(s): CKTOTAL, CKMB, CKMBINDEX, TROPONINI in the last 168 hours. BNP (last 3 results) No results for input(s): PROBNP in the last 8760 hours. HbA1C: No results for input(s): HGBA1C in the last 72 hours. CBG: Recent Labs  Lab 07/11/19 1956 07/12/19 0539 07/12/19 1206 07/12/19 2041 07/13/19 0834  GLUCAP 108* 82 116* 112* 90   Lipid Profile: No results for input(s): CHOL, HDL, LDLCALC, TRIG, CHOLHDL, LDLDIRECT in the last 72 hours. Thyroid Function Tests: No results for input(s): TSH, T4TOTAL, FREET4, T3FREE, THYROIDAB in the last 72 hours. Anemia Panel: No results for input(s): VITAMINB12, FOLATE, FERRITIN, TIBC, IRON, RETICCTPCT in the last 72 hours. Sepsis Labs: No results for input(s): PROCALCITON, LATICACIDVEN in the last 168 hours.  Recent Results (from the past 240 hour(s))  SARS CORONAVIRUS 2 (TAT 6-24 HRS) Nasopharyngeal Nasopharyngeal Swab     Status: None   Collection Time: 06/30/2019  6:07 AM   Specimen: Nasopharyngeal Swab  Result Value Ref Range Status   SARS Coronavirus 2 NEGATIVE NEGATIVE Final    Comment: (NOTE) SARS-CoV-2 target nucleic acids are NOT DETECTED. The SARS-CoV-2 RNA is generally detectable in upper and lower respiratory specimens during the acute phase of infection. Negative results do not preclude SARS-CoV-2 infection, do not rule out co-infections with other pathogens, and should not be used as the  sole basis for treatment or other patient management decisions. Negative results must be combined with clinical observations, patient history, and epidemiological information. The expected result is Negative. Fact Sheet for Patients: HairSlick.no Fact Sheet for Healthcare Providers: quierodirigir.com This test is not yet approved or cleared by the [place] FDA and  has been authorized for detection and/or diagnosis of SARS-CoV-2 by FDA under an Emergency Use Authorization (EUA). This EUA will remain  in effect (meaning this test can be used) for the duration of the COVID-19 declaration under Section 56 4(b)(1) of the Act, 21 U.S.C. section 360bbb-3(b)(1), unless the authorization is terminated or revoked sooner. Performed at [facility], [address], [place], [place] [address]   Culture, blood (routine x 2)     Status: None (Preliminary result)   Collection Time: 07/23/2019  9:37 AM   Specimen: BLOOD  Result Value Ref Range Status   Specimen Description   Final    BLOOD LEFT ANTECUBITAL Performed at [facility], [address], [place], [place] [address]    Special Requests   Final    BOTTLES DRAWN AEROBIC AND ANAEROBIC Blood Culture adequate volume Performed at [facility], [address] [place], [place] [address]    Culture   Final    NO GROWTH 4 DAYS  Performed at [facility], [place] [address], [place], [place] [address]    Report Status PENDING  Incomplete  Culture, blood (routine x 2)     Status: None (Preliminary result)   Collection Time: 2019/07/17  1:52 PM   Specimen: BLOOD LEFT HAND  Result Value Ref Range Status   Specimen Description   Final    BLOOD LEFT HAND Performed at [place] [address], [place], [place] [address]    Special Requests   Final    BOTTLES DRAWN AEROBIC ONLY Blood Culture results may not be optimal due to  an inadequate volume of blood received in culture bottles Performed at [place] [address] [place], [place] [address]    Culture   Final    NO GROWTH 4 DAYS Performed at [facility], [place] [address] [place], [place] [address]    Report Status PENDING  Incomplete  MRSA PCR Screening     Status: None   Collection Time: 07-17-19  1:56 PM   Specimen: Nasal Mucosa; Nasopharyngeal  Result Value Ref Range Status   MRSA by PCR NEGATIVE NEGATIVE Final    Comment:        The GeneXpert MRSA Assay (FDA approved for NASAL specimens only), is one component of a comprehensive MRSA colonization surveillance program. It is not intended to diagnose MRSA infection nor to guide or monitor treatment for MRSA infections. Performed at [facility], [place] [address], [place], [place] [address]   Culture, Urine     Status: Abnormal   Collection Time: 07/11/19  9:44 AM   Specimen: Urine, Catheterized  Result Value Ref Range Status   Specimen Description   Final    URINE, CATHETERIZED Performed at [place] [address] [place], [place] [address]    Special Requests   Final    NONE Performed at [facility], [place] [address] [place], [place] [address]    Culture >=100,000 COLONIES/mL ESCHERICHIA COLI (A)  Final   Report Status 07/13/2019 FINAL  Final   Organism ID, Bacteria ESCHERICHIA COLI (A)  Final      Susceptibility   Escherichia coli - MIC*    AMPICILLIN <=2 SENSITIVE Sensitive     CEFAZOLIN <=4 SENSITIVE Sensitive     CEFTRIAXONE <=1 SENSITIVE Sensitive     CIPROFLOXACIN <=0.25 SENSITIVE Sensitive     GENTAMICIN <=1 SENSITIVE Sensitive     IMIPENEM <=0.25 SENSITIVE Sensitive     NITROFURANTOIN <=16 SENSITIVE Sensitive     TRIMETH/SULFA <=20 SENSITIVE Sensitive     AMPICILLIN/SULBACTAM <=2 SENSITIVE Sensitive     PIP/TAZO <=4 SENSITIVE Sensitive     Extended ESBL NEGATIVE Sensitive     *  >=100,000 COLONIES/mL ESCHERICHIA COLI         Radiology Studies: No results found.      Scheduled Meds: . aspirin  81 mg Oral Daily  . divalproex  125 mg Oral QHS  . famotidine  10 mg Oral Daily  . feeding supplement (ENSURE ENLIVE)  237 mL Oral BID BM  . feeding supplement (PRO-STAT SUGAR FREE 64)  30 mL Oral BID  . heparin  5,000 Units Subcutaneous Q8H  . levothyroxine  88 mcg Oral QAC breakfast  . mouth rinse  15 mL Mouth Rinse BID  . mirtazapine  7.5 mg Oral QHS  . multivitamin with minerals  1 tablet Oral Daily  . PARoxetine  10 mg Oral Daily  .  polyethylene glycol  17 g Oral BID  . senna-docusate  1 tablet Oral BID   Continuous Infusions: . cefTRIAXone (ROCEPHIN)  IV Stopped (07/12/19 1330)  . dextrose 5 % and 0.45% NaCl 75 mL/hr at 07/13/19 0600     LOS: 3 days     Jacquelin Hawkingalph Westly Hinnant, MD [facility] 07/13/2019, 11:53 AM  If 7PM-7AM, please contact night-coverage [email or web address]

## 2019-07-14 DIAGNOSIS — E039 Hypothyroidism, unspecified: Secondary | ICD-10-CM | POA: Diagnosis not present

## 2019-07-14 DIAGNOSIS — R197 Diarrhea, unspecified: Secondary | ICD-10-CM | POA: Diagnosis not present

## 2019-07-14 DIAGNOSIS — F0391 Unspecified dementia with behavioral disturbance: Secondary | ICD-10-CM | POA: Diagnosis not present

## 2019-07-14 DIAGNOSIS — I10 Essential (primary) hypertension: Secondary | ICD-10-CM | POA: Diagnosis not present

## 2019-07-14 LAB — CULTURE, BLOOD (ROUTINE X 2)
Culture: NO GROWTH
Culture: NO GROWTH
Special Requests: ADEQUATE

## 2019-07-14 LAB — GLUCOSE, CAPILLARY
Glucose-Capillary: 85 mg/dL (ref 70–99)
Glucose-Capillary: 103 mg/dL — ABNORMAL HIGH (ref 70–99)

## 2019-07-14 LAB — SARS CORONAVIRUS 2 (TAT 6-24 HRS): SARS Coronavirus 2: NEGATIVE

## 2019-07-14 NOTE — Progress Notes (Signed)
PROGRESS NOTE    Alicia Benitez  BWI:[id number] DOB: 04-24-1936 DOA: 07/08/2019 PCP: Margit Hanks, MD   Brief Narrative: Alicia Benitez is a 83 y.o. female with medical history significant for advanced dementia from memory care unit, hypothyroidism, hypertension, anxiety. Patient presented from memory care unit secondary to diarrhea and found to have fecal impaction   Assessment & Plan:   Principal Problem:   Diarrhea with dehydration Active Problems:   Hypertension   Hypothyroidism   Depression, major, single episode, in partial remission (HCC)   Dementia with behavioral disturbance (HCC)   Pressure injury of skin   Diarrhea/Fecal impaction Diarrhea resolved and in setting of fecal impaction. Large bowel movement on 10/17. -Continue bowel regimen  Dehydration Associated hypotension and tachycardia. Secondary to above. Poor oral intake likely secondary to patient's underlying dementia -Continue IV hydration -If no improvement with treatment of medical illness, may require further goals of care discussions  UTI Likely source of patient's fevers. E. Coli on urine culture. Pansensitive. Blood cultures no growth to date. Afebrile. -amoxicillin (starting 10/19)  AKI Peak creatinine of 1.77. Resolved with IV fluids hydration  Tachycardia Some mild tachycardia. Mostly resolved. Likely related to infection.  Hyperkalemia Hypokalemia Patient initially with hyperkalemia on presentation and subsequently developed hypokalemia. Hyperkalemia likely secondary to AKI. Resolved.  Essential hypertension Well controlled and slightly hypotensive at times -Hold antihypertensive meds  Hypothyroidism -Continue Synthroid  GERD -Continue Pepcid  Depression -Continue Paxil, Remeron, Depakote  Dementia Oriented to person only.  Pressure injury Left hip, POA   DVT prophylaxis: Heparin Code Status:   Code Status: Full Code Family Communication: Called daughter; no  response Disposition Plan: Discharge back to memory care when bed available for transfer   Consultants:   None  Procedures:   None  Antimicrobials:  Ceftriaxone IV (10/17>>10/18)  Amoxicillin PO (10/19>>   Subjective: No issues. No pain.  Objective: Vitals:   07/13/19 0443 07/13/19 1422 07/13/19 2040 07/14/19 0534  BP: 132/86 131/75 102/81 140/78  Pulse: 65 (!) 105 (!) 104 84  Resp: 16 19 18 18   Temp: 98.2 F (36.8 C) (!) 97.5 F (36.4 C) 98.4 F (36.9 C) 98.2 F (36.8 C)  TempSrc: Axillary Oral Oral Oral  SpO2: 93% 98% 99% 94%  Weight:    50.8 kg  Height:        Intake/Output Summary (Last 24 hours) at 07/14/2019 1235 Last data filed at 07/14/2019 1000 Gross per 24 hour  Intake 1956.78 ml  Output 450 ml  Net 1506.78 ml   Filed Weights   07/03/2019 1311 07/14/19 0534  Weight: 49 kg 50.8 kg    Examination:  General exam: Appears calm and comfortable Respiratory system: Clear to auscultation. Respiratory effort normal. Cardiovascular system: S1 & S2 heard, RRR. No murmurs, rubs, gallops or clicks. Gastrointestinal system: Abdomen is nondistended, soft and nontender. No organomegaly or masses felt. Normal bowel sounds heard. Central nervous system: Alert and oriented to person only. Extremities: No edema. No calf tenderness Skin: No cyanosis. No rashes Psychiatry: Judgement and insight appear impaired.    Data Reviewed: I have personally reviewed following labs and imaging studies  CBC: Recent Labs  Lab 06/26/2019 0500 07/10/19 0343 07/11/19 0912 07/13/19 1201  WBC 12.7* 6.4 7.3 6.7  NEUTROABS 9.4*  --   --   --   HGB 11.7* 8.9* 8.8* 9.9*  HCT 37.5 28.3* 28.0* 31.6*  MCV 96.2 94.0 95.6 94.0  PLT 258 203 174 232   Basic Metabolic Panel: Recent Labs  Lab 06/29/2019 0611 06/26/2019 0933 07/10/19 0343 07/11/19 0912 07/13/19 1201  NA 136 140 139 142 139  K 6.5* 4.5 2.7* 3.6 3.0*  CL 106 110 112* 115* 105  CO2 19* 22 18* 20* 26  GLUCOSE 91  61* 96 93 118*  BUN 74* 67* 48* 25* 13  CREATININE 1.77* 1.59* 1.05* 0.67 0.60  CALCIUM 8.3* 7.6* 7.4* 8.1* 7.9*   GFR: Estimated Creatinine Clearance: 44.2 mL/min (by C-G formula based on SCr of 0.6 mg/dL). Liver Function Tests: Recent Labs  Lab 07/16/2019 0611 07/10/19 0343  AST 14* 11*  ALT 6 7  ALKPHOS 63 50  BILITOT 0.5 0.7  PROT 5.5* 4.5*  ALBUMIN 2.6* 2.1*   No results for input(s): LIPASE, AMYLASE in the last 168 hours. No results for input(s): AMMONIA in the last 168 hours. Coagulation Profile: No results for input(s): INR, PROTIME in the last 168 hours. Cardiac Enzymes: No results for input(s): CKTOTAL, CKMB, CKMBINDEX, TROPONINI in the last 168 hours. BNP (last 3 results) No results for input(s): PROBNP in the last 8760 hours. HbA1C: No results for input(s): HGBA1C in the last 72 hours. CBG: Recent Labs  Lab 07/12/19 2041 07/13/19 0834 07/13/19 1648 07/13/19 2139 07/14/19 0747  GLUCAP 112* 90 118* 94 85   Lipid Profile: No results for input(s): CHOL, HDL, LDLCALC, TRIG, CHOLHDL, LDLDIRECT in the last 72 hours. Thyroid Function Tests: No results for input(s): TSH, T4TOTAL, FREET4, T3FREE, THYROIDAB in the last 72 hours. Anemia Panel: No results for input(s): VITAMINB12, FOLATE, FERRITIN, TIBC, IRON, RETICCTPCT in the last 72 hours. Sepsis Labs: No results for input(s): PROCALCITON, LATICACIDVEN in the last 168 hours.  Recent Results (from the past 240 hour(s))  SARS CORONAVIRUS 2 (TAT 6-24 HRS) Nasopharyngeal Nasopharyngeal Swab     Status: None   Collection Time: 07/25/2019  6:07 AM   Specimen: Nasopharyngeal Swab  Result Value Ref Range Status   SARS Coronavirus 2 NEGATIVE NEGATIVE Final    Comment: (NOTE) SARS-CoV-2 target nucleic acids are NOT DETECTED. The SARS-CoV-2 RNA is generally detectable in upper and lower respiratory specimens during the acute phase of infection. Negative results do not preclude SARS-CoV-2 infection, do not rule out  co-infections with other pathogens, and should not be used as the sole basis for treatment or other patient management decisions. Negative results must be combined with clinical observations, patient history, and epidemiological information. The expected result is Negative. Fact Sheet for Patients: HairSlick.no Fact Sheet for Healthcare Providers: quierodirigir.com This test is not yet approved or cleared by the [place] FDA and  has been authorized for detection and/or diagnosis of SARS-CoV-2 by FDA under an Emergency Use Authorization (EUA). This EUA will remain  in effect (meaning this test can be used) for the duration of the COVID-19 declaration under Section 56 4(b)(1) of the Act, 21 U.S.C. section 360bbb-3(b)(1), unless the authorization is terminated or revoked sooner. Performed at [facility], [address] [place], [place] [address]   Culture, blood (routine x 2)     Status: None   Collection Time: 07/13/2019  9:37 AM   Specimen: BLOOD  Result Value Ref Range Status   Specimen Description   Final    BLOOD LEFT ANTECUBITAL Performed at [facility], [address] [place], [place] [address]    Special Requests   Final    BOTTLES DRAWN AEROBIC AND ANAEROBIC Blood Culture adequate volume Performed at [facility], [address] [place], [place] [address]  Culture   Final    NO GROWTH 5 DAYS Performed at [facility], [address] [place], [place] [address]    Report Status 07/14/2019 FINAL  Final  Culture, blood (routine x 2)     Status: None   Collection Time: 11-06-2018  1:52 PM   Specimen: BLOOD LEFT HAND  Result Value Ref Range Status   Specimen Description   Final    BLOOD LEFT HAND Performed at [facility], [address] [place], [place] [address]    Special Requests   Final    BOTTLES DRAWN AEROBIC ONLY Blood Culture  results may not be optimal due to an inadequate volume of blood received in culture bottles Performed at [facility], [address] [place], [place] [address]    Culture   Final    NO GROWTH 5 DAYS Performed at [facility], [address] [place], [place] [address]    Report Status 07/14/2019 FINAL  Final  MRSA PCR Screening     Status: None   Collection Time: 11-06-2018  1:56 PM   Specimen: Nasal Mucosa; Nasopharyngeal  Result Value Ref Range Status   MRSA by PCR NEGATIVE NEGATIVE Final    Comment:        The GeneXpert MRSA Assay (FDA approved for NASAL specimens only), is one component of a comprehensive MRSA colonization surveillance program. It is not intended to diagnose MRSA infection nor to guide or monitor treatment for MRSA infections. Performed at [facility], [address] [place], [place] [address]   Culture, Urine     Status: Abnormal   Collection Time: 07/11/19  9:44 AM   Specimen: Urine, Catheterized  Result Value Ref Range Status   Specimen Description   Final    URINE, CATHETERIZED Performed at [facility], [address] [place], [place] [address]    Special Requests   Final    NONE Performed at [facility], [address] [place], [place] [address]    Culture >=100,000 COLONIES/mL ESCHERICHIA COLI (A)  Final   Report Status 07/13/2019 FINAL  Final   Organism ID, Bacteria ESCHERICHIA COLI (A)  Final      Susceptibility   Escherichia coli - MIC*    AMPICILLIN <=2 SENSITIVE Sensitive     CEFAZOLIN <=4 SENSITIVE Sensitive     CEFTRIAXONE <=1 SENSITIVE Sensitive     CIPROFLOXACIN <=0.25 SENSITIVE Sensitive     GENTAMICIN <=1 SENSITIVE Sensitive     IMIPENEM <=0.25 SENSITIVE Sensitive     NITROFURANTOIN <=16 SENSITIVE Sensitive     TRIMETH/SULFA <=20 SENSITIVE Sensitive     AMPICILLIN/SULBACTAM <=2 SENSITIVE Sensitive     PIP/TAZO <=4 SENSITIVE Sensitive      Extended ESBL NEGATIVE Sensitive     * >=100,000 COLONIES/mL ESCHERICHIA COLI         Radiology Studies: No results found.      Scheduled Meds: . amoxicillin  500 mg Oral Q8H  . aspirin  81 mg Oral Daily  . divalproex  125 mg Oral QHS  . famotidine  10 mg Oral Daily  . feeding supplement (ENSURE ENLIVE)  237 mL Oral BID BM  . feeding supplement (PRO-STAT SUGAR FREE 64)  30 mL Oral BID  . heparin  5,000 Units Subcutaneous Q8H  . levothyroxine  88 mcg Oral QAC breakfast  . mouth rinse  15 mL Mouth Rinse BID  . mirtazapine  7.5 mg Oral  QHS  . multivitamin with minerals  1 tablet Oral Daily  . PARoxetine  10 mg Oral Daily  . polyethylene glycol  17 g Oral BID  . senna-docusate  1 tablet Oral BID   Continuous Infusions: . dextrose 5 % and 0.45% NaCl 75 mL/hr at 07/14/19 0745     LOS: 4 days     Cordelia Poche, MD [facility] 07/14/2019, 12:35 PM  If 7PM-7AM, please contact night-coverage [email or web address]

## 2019-07-14 NOTE — TOC Initial Note (Signed)
Transition of Care [facility]) - Initial/Assessment Note    Patient Details  Name: Alicia Benitez MRN: [id number] Date of Birth: [birth year]  Transition of Care [facility]) CM/SW Contact:    Alicia Mage, LCSW Phone Number: 07/14/2019, 3:13 PM  Clinical Narrative:  Pt resides for the past couple of weeks at [facility].  She needs help with ADL's and transferring from bed to wheel chair.   They are holding her bed, and at d/c will require negative COVID test, FL2 that clearly states memory care level of care, recommendations, if any, for [facility] PT and PO meds only.    Spoke to son-in-law who states she was previously at [facility] for about 6 months, and they had hoped to move her much sooner, but were unable to do so due to [place].  He states she is much more withdrawn than she was prior to [facility], and is basically non-communicative with them.  Has also lost a lot of weight.  The family is supportive of her return to [facility]. TOC will continue to follow during the course of hospitalization.           Expected Discharge Plan: Memory Care     Patient Goals and CMS Choice        Expected Discharge Plan and Services Expected Discharge Plan: Memory Care   Discharge Planning Services: CM Consult   Living arrangements for the past 2 months: [place]                                      Prior Living Arrangements/Services Living arrangements for the past 2 months: [place] Lives with:: Facility Resident Patient language and need for interpreter reviewed:: Yes Do you feel safe going back to the place where you live?: Yes      Need for Family Participation in Patient Care: Yes (Comment) Care giver support system in place?: Yes (comment) Current home services: DME Criminal Activity/Legal Involvement Pertinent to Current Situation/Hospitalization: No - Comment as needed  Activities of Daily Living Home Assistive Devices/Equipment:  Grab bars around toilet, Grab bars in shower, Hand-held shower hose, Hospital bed, Wheelchair, Blood pressure cuff, Scales(carriage house has necessary equipment for their residents) ADL Screening (condition at time of admission) Patient's cognitive ability adequate to safely complete daily activities?: No Is the patient deaf or have difficulty hearing?: No Does the patient have difficulty seeing, even when wearing glasses/contacts?: No Does the patient have difficulty concentrating, remembering, or making decisions?: Yes Patient able to express need for assistance with ADLs?: No Does the patient have difficulty dressing or bathing?: Yes Independently performs ADLs?: No Communication: Independent Dressing (OT): Dependent Is this a change from baseline?: Pre-admission baseline Grooming: Dependent Is this a change from baseline?: Pre-admission baseline Feeding: Dependent Is this a change from baseline?: Pre-admission baseline Bathing: Dependent Is this a change from baseline?: Pre-admission baseline Toileting: Dependent Is this a change from baseline?: Pre-admission baseline In/Out Bed: Dependent Is this a change from baseline?: Pre-admission baseline Walks in Home: Dependent Is this a change from baseline?: Pre-admission baseline Does the patient have difficulty walking or climbing stairs?: Yes(secondary to weakness) Weakness of Legs: Both Weakness of Arms/Hands: Both  Permission Sought/Granted Permission sought to share information with : Family Supports    Share Information with NAME: Alicia Benitez     Permission granted to share info w Relationship: son-in-law  Permission  granted to share info w Contact Information: [phone]  Emotional Assessment Appearance:: Appears stated age     Orientation: : Oriented to Self Alcohol / Substance Use: Not Applicable Psych Involvement: No (comment)  Admission diagnosis:  Hyperkalemia [E87.5] Leukocytosis [D72.829] Acute kidney  injury (HCC) [N17.9] Diarrhea, unspecified type [R19.7] Patient Active Problem List   Diagnosis Date Noted  . Diarrhea with dehydration 08/04/19  . Pressure injury of skin 04-Aug-2019  . Mood disorder (HCC) 03/08/2019  . Compression fracture of L1 lumbar vertebra (HCC) 12/21/2018  . Closed fracture of transverse process of thoracic vertebra (HCC) 12/21/2018  . Hypertension 12/21/2018  . Hypothyroidism 12/21/2018  . Depression, major, single episode, in partial remission (HCC) 12/21/2018  . GERD (gastroesophageal reflux disease) 12/21/2018  . Dementia with behavioral disturbance (HCC) 12/21/2018  . Multiple rib fractures 12/21/2018  . Lung contusion 12/21/2018  . Closed fracture dislocation of lumbar spine (HCC) 11/08/2018  . Thoracic spine fracture (HCC) 10/21/2018   PCP:  Alicia Hanks, MD Pharmacy:  No Pharmacies Listed    Social Determinants of Health (SDOH) Interventions    Readmission Risk Interventions No flowsheet data found.

## 2019-07-14 NOTE — Care Management Important Message (Signed)
Important Message  Patient Details IM Letter given to Nancy Marus RN to present to the Patient Name: Alicia Benitez MRN: [id number] Date of Birth: 04-06-1936   Medicare Important Message Given:  Yes     Kerin Salen 07/14/2019, 11:44 AM

## 2019-07-14 NOTE — Progress Notes (Signed)
SLP Cancellation Note  Patient Details Name: Alicia Benitez MRN: [id number] DOB: 1935-10-22   Cancelled treatment:       Reason Eval/Treat Not Completed: Other (comment)(pt not alert enough to have po, RN reports she is tolerating po but does not consume much)  Luanna Salk, [place] [facility] SLP Acute Rehab Services Pager [phone] Office [phone]  Macario Golds 07/14/2019, 2:39 PM

## 2019-07-14 NOTE — Plan of Care (Signed)
  Problem: Health Behavior/Discharge Planning: Goal: Ability to manage health-related needs will improve Outcome: Progressing   Problem: Clinical Measurements: Goal: Will remain free from infection Outcome: Progressing Goal: Diagnostic test results will improve Outcome: Progressing Goal: Respiratory complications will improve Outcome: Completed/Met Goal: Cardiovascular complication will be avoided Outcome: Progressing   Problem: Activity: Goal: Risk for activity intolerance will decrease Outcome: Progressing   Problem: Nutrition: Goal: Adequate nutrition will be maintained Outcome: Not Progressing   Problem: Coping: Goal: Level of anxiety will decrease Outcome: Progressing   Problem: Elimination: Goal: Will not experience complications related to bowel motility Outcome: Progressing Goal: Will not experience complications related to urinary retention Outcome: Progressing   Problem: Pain Managment: Goal: General experience of comfort will improve Outcome: Completed/Met   Problem: Safety: Goal: Ability to remain free from injury will improve Outcome: Progressing   Problem: Skin Integrity: Goal: Risk for impaired skin integrity will decrease Outcome: Progressing

## 2019-07-15 ENCOUNTER — Inpatient Hospital Stay (HOSPITAL_COMMUNITY): Payer: Medicare Other

## 2019-07-15 DIAGNOSIS — E039 Hypothyroidism, unspecified: Secondary | ICD-10-CM | POA: Diagnosis not present

## 2019-07-15 DIAGNOSIS — F0391 Unspecified dementia with behavioral disturbance: Secondary | ICD-10-CM | POA: Diagnosis not present

## 2019-07-15 DIAGNOSIS — R197 Diarrhea, unspecified: Secondary | ICD-10-CM | POA: Diagnosis not present

## 2019-07-15 DIAGNOSIS — I10 Essential (primary) hypertension: Secondary | ICD-10-CM | POA: Diagnosis not present

## 2019-07-15 LAB — COMPREHENSIVE METABOLIC PANEL
ALT: 7 U/L (ref 0–44)
AST: 9 U/L — ABNORMAL LOW (ref 15–41)
Albumin: 1.7 g/dL — ABNORMAL LOW (ref 3.5–5.0)
Alkaline Phosphatase: 60 U/L (ref 38–126)
Anion gap: 7 (ref 5–15)
BUN: 26 mg/dL — ABNORMAL HIGH (ref 8–23)
CO2: 25 mmol/L (ref 22–32)
Calcium: 7.9 mg/dL — ABNORMAL LOW (ref 8.9–10.3)
Chloride: 106 mmol/L (ref 98–111)
Creatinine, Ser: 0.51 mg/dL (ref 0.44–1.00)
GFR calc Af Amer: >60 mL/min (ref >60)
GFR calc non Af Amer: >60 mL/min (ref >60)
Glucose, Bld: 132 mg/dL — ABNORMAL HIGH (ref 70–99)
Potassium: 3.5 mmol/L (ref 3.5–5.1)
Sodium: 138 mmol/L (ref 135–145)
Total Bilirubin: 0.4 mg/dL (ref 0.3–1.2)
Total Protein: 4.4 g/dL — ABNORMAL LOW (ref 6.5–8.1)

## 2019-07-15 LAB — CBC
HCT: 26 % — ABNORMAL LOW (ref 36.0–46.0)
Hemoglobin: 8.2 g/dL — ABNORMAL LOW (ref 12.0–15.0)
MCH: 29.7 pg (ref 26.0–34.0)
MCHC: 31.5 g/dL (ref 30.0–36.0)
MCV: 94.2 fL (ref 80.0–100.0)
Platelets: 249 10*3/uL (ref 150–400)
RBC: 2.76 MIL/uL — ABNORMAL LOW (ref 3.87–5.11)
RDW: 15.4 % (ref 11.5–15.5)
WBC: 8.3 10*3/uL (ref 4.0–10.5)
nRBC: 0 % (ref 0.0–0.2)

## 2019-07-15 LAB — GLUCOSE, CAPILLARY
Glucose-Capillary: 104 mg/dL — ABNORMAL HIGH (ref 70–99)
Glucose-Capillary: 105 mg/dL — ABNORMAL HIGH (ref 70–99)
Glucose-Capillary: 111 mg/dL — ABNORMAL HIGH (ref 70–99)
Glucose-Capillary: 122 mg/dL — ABNORMAL HIGH (ref 70–99)
Glucose-Capillary: 125 mg/dL — ABNORMAL HIGH (ref 70–99)

## 2019-07-15 NOTE — Progress Notes (Signed)
Nutrition Follow-up  DOCUMENTATION CODES:   Underweight  INTERVENTION:  - continue Ensure Enlive, Magic Cup, and 30 ml prostat BID. - floor staff to assist with feeding and to continue to encourage PO intakes.    NUTRITION DIAGNOSIS:   Increased nutrient needs related to wound healing as evidenced by estimated needs. -ongoing  GOAL:   Patient will meet greater than or equal to 90% of their needs -unmet  MONITOR:   PO intake, Supplement acceptance, Labs, Weight trends, Skin  ASSESSMENT:   83 y.o. female with medical history significant for advanced dementia from memory care unit, hypothyroidism, HTN, and anxiety. She was taken from memory care unit to the ED with a fever and diarrhea for ~24 hours. Patient mumbles, unable to hold a conversation. CT abdomen showed large stool burden concerning for fecal impaction.  Per flow sheet documentation, patient is a/o to self only. She consumed 5% of breakfast and 0% of dinner on 10/17; 10% of breakfast and lunch and 0% of dinner on 10/18; 25% of breakfast, 0% of lunch, and 10% of dinner on 10/19. Per review of orders, patient accepted 8 of 10 bottles of Ensure and 11 of 13 packets of prostat offered to her.   RD did not enter patient's room to decrease contact as patient is unable to provide information. Suspect patient meets criteria for some degree of malnutrition given underweight BMI and poor intakes of meals.   Per notes: - diarrhea/fecal impaction--resolved - abdominal distention--thought to possibly be 2/2 urinary retention vs GI process; plan for abdominal x-ray - dehydration-- IV fluid ordered  - poor oral intake thought to be 2/2 underlying dementia - possible need for further GOC discussions--patient is full code at this time - UTI - AKI--resolved with IV fluids - plan for d/c back to memory care unit when medically ready for d/c   Labs reviewed; CBGs: 125, 104, and 111 mg/dl, K: 3 mmol/l, Ca: 7.9 mg/dl. Medications  reviewed; 88 mcg oral synthroid/day, 10 mg oral pepcid/day, daily multivitamin with minerals, 1 packet miralax BID, 1 tablet senokot BID.  IVF; D5-1/2 NS @ 75 ml/hr (306 kcal).     NUTRITION - FOCUSED PHYSICAL EXAM:  unable to complete at this time.   Diet Order:   Diet Order            DIET DYS 3 Room service appropriate? Yes; Fluid consistency: Thin  Diet effective now              EDUCATION NEEDS:   No education needs have been identified at this time  Skin:  Skin Assessment: Skin Integrity Issues: Skin Integrity Issues:: Stage II, DTI, Unstageable DTI: L hip Stage II: L hip Unstageable: full thickness L IT  Last BM:  10/20  Height:   Ht Readings from Last 1 Encounters:  03-Aug-2019 5\' 6"  (1.676 m)    Weight:   Wt Readings from Last 1 Encounters:  07/14/19 50.8 kg    Ideal Body Weight:  59.1 kg  BMI:  Body mass index is 18.08 kg/m.  Estimated Nutritional Needs:   Kcal:  1735-1960 kcal  Protein:  75-85 grams  Fluid:  >/= 2 L/day      Jarome Matin, MS, RD, LDN, [facility] Inpatient Clinical Dietitian Pager # [phone] After hours/weekend pager # [phone]

## 2019-07-15 NOTE — Plan of Care (Signed)
   Problem: Clinical Measurements: Goal: Ability to maintain clinical measurements within normal limits will improve Outcome: Progressing Goal: Diagnostic test results will improve Outcome: Progressing   Problem: Nutrition: Goal: Adequate nutrition will be maintained Outcome: Progressing  Patient with poor appetite, needs encouragement to eat, but does well with nutritional supplements when offered

## 2019-07-15 NOTE — Progress Notes (Addendum)
PROGRESS NOTE    Alicia Benitez  EAV:[id number] DOB: 20-Nov-1935 DOA: 17-Jul-2019 PCP: Margit HanksAlexander, Anne D, MD   Brief Narrative: Alicia Benitez is a 83 y.o. female with medical history significant for advanced dementia from memory care unit, hypothyroidism, hypertension, anxiety. Patient presented from memory care unit secondary to diarrhea and found to have fecal impaction   Assessment & Plan:   Principal Problem:   Diarrhea with dehydration Active Problems:   Hypertension   Hypothyroidism   Depression, major, single episode, in partial remission (HCC)   Dementia with behavioral disturbance (HCC)   Pressure injury of skin   Diarrhea/Fecal impaction Diarrhea resolved and in setting of fecal impaction. Large bowel movement on 10/17 and again on 10/27. -Continue bowel regimen  Abdominal distension Possibly secondary to urinary retention vs GI process. Bladder scan without significant retention -Abdominal x-ray to rule out obstruction; if unremarkable, will obtain renal ultrasound -CMP, CBC  Dehydration Associated hypotension and tachycardia. Secondary to above. Poor oral intake likely secondary to patient's underlying dementia -Continue IV hydration -If no improvement with treatment of medical illness, may require further goals of care discussions  UTI Likely source of patient's fevers. E. Coli on urine culture. Pansensitive. Blood cultures no growth to date. Afebrile. -amoxicillin (starting 10/19)  AKI Peak creatinine of 1.77. Resolved with IV fluids hydration  Tachycardia Some mild tachycardia. Mostly resolved. Likely related to infection.  Hyperkalemia Hypokalemia Patient initially with hyperkalemia on presentation and subsequently developed hypokalemia. Hyperkalemia likely secondary to AKI. Resolved.  Essential hypertension Well controlled and slightly hypotensive at times -Hold antihypertensive meds  Hypothyroidism -Continue Synthroid  GERD -Continue Pepcid   Depression -Continue Paxil, Remeron, Depakote  Dementia Oriented to person only.  Pressure injury Left hip, POA   DVT prophylaxis: Heparin Code Status:   Code Status: Full Code Family Communication: None at bedside Disposition Plan: Discharge back to memory care pending workup for abdominal distention   Consultants:   None  Procedures:   None  Antimicrobials:  Ceftriaxone IV (10/17>>10/18)  Amoxicillin PO (10/19>>   Subjective: Patient reports some sternal pain and some suprapubic pain  Objective: Vitals:   07/13/19 2040 07/14/19 0534 07/14/19 1257 07/14/19 2033  BP: 102/81 140/78 (!) 99/54 (!) 148/80  Pulse:  84 93 100  Resp: 18 18 16 17   Temp: 98.4 F (36.9 C) 98.2 F (36.8 C) 98.5 F (36.9 C) 97.9 F (36.6 C)  TempSrc: Oral Oral Oral Oral  SpO2: 99% 94% 100% 96%  Weight:  50.8 kg    Height:        Intake/Output Summary (Last 24 hours) at 07/15/2019 1039 Last data filed at 07/15/2019 0600 Gross per 24 hour  Intake 1854.46 ml  Output 850 ml  Net 1004.46 ml   Filed Weights   08-Apr-2019 1311 07/14/19 0534  Weight: 49 kg 50.8 kg    Examination:  General exam: Appears calm and comfortable Respiratory system: Clear to auscultation. Respiratory effort normal. Cardiovascular system: S1 & S2 heard, RRR. No murmurs, rubs, gallops or clicks. Gastrointestinal system: Abdomen is distended, soft and tender in suprapubic to periumbilical area. No organomegaly or masses felt. Decreased bowel sounds heard. Central nervous system: Alert and oriented to person. No focal neurological deficits. Extremities: No edema. No calf tenderness Skin: No cyanosis. No rashes Psychiatry: Judgement and insight appear impaired.    Data Reviewed: I have personally reviewed following labs and imaging studies  CBC: Recent Labs  Lab 08-Apr-2019 0500 07/10/19 0343 07/11/19 0912 07/13/19 1201  WBC 12.7* 6.4  7.3 6.7  NEUTROABS 9.4*  --   --   --   HGB 11.7* 8.9* 8.8* 9.9*   HCT 37.5 28.3* 28.0* 31.6*  MCV 96.2 94.0 95.6 94.0  PLT 258 203 174 232   Basic Metabolic Panel: Recent Labs  Lab 07-28-2019 0611 07/28/2019 0933 07/10/19 0343 07/11/19 0912 07/13/19 1201  NA 136 140 139 142 139  K 6.5* 4.5 2.7* 3.6 3.0*  CL 106 110 112* 115* 105  CO2 19* 22 18* 20* 26  GLUCOSE 91 61* 96 93 118*  BUN 74* 67* 48* 25* 13  CREATININE 1.77* 1.59* 1.05* 0.67 0.60  CALCIUM 8.3* 7.6* 7.4* 8.1* 7.9*   GFR: Estimated Creatinine Clearance: 44.2 mL/min (by C-G formula based on SCr of 0.6 mg/dL). Liver Function Tests: Recent Labs  Lab 2019-07-28 0611 07/10/19 0343  AST 14* 11*  ALT 6 7  ALKPHOS 63 50  BILITOT 0.5 0.7  PROT 5.5* 4.5*  ALBUMIN 2.6* 2.1*   No results for input(s): LIPASE, AMYLASE in the last 168 hours. No results for input(s): AMMONIA in the last 168 hours. Coagulation Profile: No results for input(s): INR, PROTIME in the last 168 hours. Cardiac Enzymes: No results for input(s): CKTOTAL, CKMB, CKMBINDEX, TROPONINI in the last 168 hours. BNP (last 3 results) No results for input(s): PROBNP in the last 8760 hours. HbA1C: No results for input(s): HGBA1C in the last 72 hours. CBG: Recent Labs  Lab 07/14/19 0747 07/14/19 1624 07/15/19 0049 07/15/19 0624 07/15/19 0733  GLUCAP 85 103* 125* 104* 111*   Lipid Profile: No results for input(s): CHOL, HDL, LDLCALC, TRIG, CHOLHDL, LDLDIRECT in the last 72 hours. Thyroid Function Tests: No results for input(s): TSH, T4TOTAL, FREET4, T3FREE, THYROIDAB in the last 72 hours. Anemia Panel: No results for input(s): VITAMINB12, FOLATE, FERRITIN, TIBC, IRON, RETICCTPCT in the last 72 hours. Sepsis Labs: No results for input(s): PROCALCITON, LATICACIDVEN in the last 168 hours.  Recent Results (from the past 240 hour(s))  SARS CORONAVIRUS 2 (TAT 6-24 HRS) Nasopharyngeal Nasopharyngeal Swab     Status: None   Collection Time: 07-28-19  6:07 AM   Specimen: Nasopharyngeal Swab  Result Value Ref Range Status    SARS Coronavirus 2 NEGATIVE NEGATIVE Final    Comment: (NOTE) SARS-CoV-2 target nucleic acids are NOT DETECTED. The SARS-CoV-2 RNA is generally detectable in upper and lower respiratory specimens during the acute phase of infection. Negative results do not preclude SARS-CoV-2 infection, do not rule out co-infections with other pathogens, and should not be used as the sole basis for treatment or other patient management decisions. Negative results must be combined with clinical observations, patient history, and epidemiological information. The expected result is Negative. Fact Sheet for Patients: HairSlick.no Fact Sheet for Healthcare Providers: quierodirigir.com This test is not yet approved or cleared by the [place] FDA and  has been authorized for detection and/or diagnosis of SARS-CoV-2 by FDA under an Emergency Use Authorization (EUA). This EUA will remain  in effect (meaning this test can be used) for the duration of the COVID-19 declaration under Section 56 4(b)(1) of the Act, 21 U.S.C. section 360bbb-3(b)(1), unless the authorization is terminated or revoked sooner. Performed at [facility], [address], [place], [place] [address]   Culture, blood (routine x 2)     Status: None   Collection Time: July 28, 2019  9:37 AM   Specimen: BLOOD  Result Value Ref Range Status   Specimen Description   Final    BLOOD LEFT ANTECUBITAL  Performed at [facility], [place] [address], [place], [place] [address]    Special Requests   Final    BOTTLES DRAWN AEROBIC AND ANAEROBIC Blood Culture adequate volume Performed at [place] [address], [place], [place] [address]    Culture   Final    NO GROWTH 5 DAYS Performed at [facility], [place] [address], [place], [place] [address]    Report Status 07/14/2019 FINAL  Final  Culture, blood (routine x 2)     Status: None    Collection Time: 07/24/2019  1:52 PM   Specimen: BLOOD LEFT HAND  Result Value Ref Range Status   Specimen Description   Final    BLOOD LEFT HAND Performed at [place] [address] [place], [place] [address]    Special Requests   Final    BOTTLES DRAWN AEROBIC ONLY Blood Culture results may not be optimal due to an inadequate volume of blood received in culture bottles Performed at [place] [address], [place], [place] [address]    Culture   Final    NO GROWTH 5 DAYS Performed at [facility], [place] [address], [place], [place] [address]    Report Status 07/14/2019 FINAL  Final  MRSA PCR Screening     Status: None   Collection Time: 2019/07/24  1:56 PM   Specimen: Nasal Mucosa; Nasopharyngeal  Result Value Ref Range Status   MRSA by PCR NEGATIVE NEGATIVE Final    Comment:        The GeneXpert MRSA Assay (FDA approved for NASAL specimens only), is one component of a comprehensive MRSA colonization surveillance program. It is not intended to diagnose MRSA infection nor to guide or monitor treatment for MRSA infections. Performed at [facility], [place] [address] [place], [place] [address]   Culture, Urine     Status: Abnormal   Collection Time: 07/11/19  9:44 AM   Specimen: Urine, Catheterized  Result Value Ref Range Status   Specimen Description   Final    URINE, CATHETERIZED Performed at [place] [address] [place], [place] [address]    Special Requests   Final    NONE Performed at [facility], [place] [address] [place], [place] [address]    Culture >=100,000 COLONIES/mL ESCHERICHIA COLI (A)  Final   Report Status 07/13/2019 FINAL  Final   Organism ID, Bacteria ESCHERICHIA COLI (A)  Final      Susceptibility   Escherichia coli - MIC*    AMPICILLIN <=2 SENSITIVE Sensitive     CEFAZOLIN <=4 SENSITIVE Sensitive     CEFTRIAXONE <=1 SENSITIVE Sensitive      CIPROFLOXACIN <=0.25 SENSITIVE Sensitive     GENTAMICIN <=1 SENSITIVE Sensitive     IMIPENEM <=0.25 SENSITIVE Sensitive     NITROFURANTOIN <=16 SENSITIVE Sensitive     TRIMETH/SULFA <=20 SENSITIVE Sensitive     AMPICILLIN/SULBACTAM <=2 SENSITIVE Sensitive     PIP/TAZO <=4 SENSITIVE Sensitive     Extended ESBL NEGATIVE Sensitive     * >=100,000 COLONIES/mL ESCHERICHIA COLI  SARS CORONAVIRUS 2 (TAT 6-24 HRS) Nasopharyngeal Nasopharyngeal Swab     Status: None   Collection Time: 07/14/19  9:59 AM   Specimen: Nasopharyngeal Swab  Result Value Ref Range Status   SARS Coronavirus 2 NEGATIVE NEGATIVE Final    Comment: (NOTE) SARS-CoV-2 target nucleic acids are NOT DETECTED. The SARS-CoV-2 RNA is generally detectable  in upper and lower respiratory specimens during the acute phase of infection. Negative results do not preclude SARS-CoV-2 infection, do not rule out co-infections with other pathogens, and should not be used as the sole basis for treatment or other patient management decisions. Negative results must be combined with clinical observations, patient history, and epidemiological information. The expected result is Negative. Fact Sheet for Patients: HairSlick.no Fact Sheet for Healthcare Providers: quierodirigir.com This test is not yet approved or cleared by the [place] FDA and  has been authorized for detection and/or diagnosis of SARS-CoV-2 by FDA under an Emergency Use Authorization (EUA). This EUA will remain  in effect (meaning this test can be used) for the duration of the COVID-19 declaration under Section 56 4(b)(1) of the Act, 21 U.S.C. section 360bbb-3(b)(1), unless the authorization is terminated or revoked sooner. Performed at [facility], [address] [place], [place] [address]          Radiology Studies: No results found.      Scheduled Meds: . amoxicillin  500 mg Oral Q8H  .  aspirin  81 mg Oral Daily  . divalproex  125 mg Oral QHS  . famotidine  10 mg Oral Daily  . feeding supplement (ENSURE ENLIVE)  237 mL Oral BID BM  . feeding supplement (PRO-STAT SUGAR FREE 64)  30 mL Oral BID  . heparin  5,000 Units Subcutaneous Q8H  . levothyroxine  88 mcg Oral QAC breakfast  . mouth rinse  15 mL Mouth Rinse BID  . mirtazapine  7.5 mg Oral QHS  . multivitamin with minerals  1 tablet Oral Daily  . PARoxetine  10 mg Oral Daily  . polyethylene glycol  17 g Oral BID  . senna-docusate  1 tablet Oral BID   Continuous Infusions: . dextrose 5 % and 0.45% NaCl 75 mL/hr at 07/15/19 0751     LOS: 5 days     Jacquelin Hawking, MD [facility] 07/15/2019, 10:39 AM  If 7PM-7AM, please contact night-coverage [email or web address]

## 2019-07-15 NOTE — Progress Notes (Signed)
SLP Cancellation Note  Patient Details Name: Alicia Benitez MRN: [id number] DOB: 03-01-1936   Cancelled treatment:       Reason Eval/Treat Not Completed: Fatigue/lethargy limiting ability to participate(Physical therapy informed SLP that pt was very lethargic during PT session, Recommend consider palliative consult given ongoing poor intake and lack of improvement with PT.)   Macario Golds 07/15/2019, 3:11 PM  Luanna Salk, [facility] SLP Acute Rehab Services Pager [phone] Office [phone]

## 2019-07-15 NOTE — Progress Notes (Signed)
Physical Therapy Treatment Patient Details Name: Maybelle Depaoli MRN: [id number] DOB: 1936-03-02 Today's Date: 07/15/2019    History of Present Illness 83 y.o. female with medical history significant for advanced dementia from memory care unit, hypothyroidism, hypertension, anxiety was brought from memory care unit with complaints of reported fever and diarrhea    PT Comments    Pt in bed difficult to arouse.  Brief moments of open eyes and appears to be AxO x 1.  She did tell me she likes to be called PAT.  Assisted OOB to recliner.  Sit to sidelying: Total assist.  General transfer comment: "Bear Hug" stand pivot 1/4 turn from elvated bed to recliner.  Pt unable to support self and unable to offer any assist.  Pt 0%.  Rec nursing use lift. Pt is NOT progressing with her mobility.  Follow Up Recommendations  SNF     Equipment Recommendations  None recommended by PT    Recommendations for Other Services       Precautions / Restrictions Precautions Precautions: Fall Precaution Comments: Hx Dementia Restrictions Weight Bearing Restrictions: No    Mobility  Bed Mobility Overal bed mobility: Needs Assistance Bed Mobility: Rolling;Sidelying to Sit;Sit to Sidelying   Sidelying to sit: Total assist Supine to sit: Total assist(0%)   Sit to sidelying: Total assist General bed mobility comments: pt required Total Assist to EOB.  Then MAX Assist to maintain static sitting balance.  Unable to support self.  Present with poor Kyphotic posture.  No self righting to midline.  LOB all planes.  Transfers Overall transfer level: Needs assistance Equipment used: None Transfers: Stand Pivot Transfers   Stand pivot transfers: Total assist;+2 physical assistance;+2 safety/equipment       General transfer comment: "Bear Hug" stand pivot 1/4 turn from elvated bed to recliner.  Pt unable to support self and unable to offer any assist.  Pt 0%.  Rec nursing use lift.  Ambulation/Gait             General Gait Details: unable   Stairs             Wheelchair Mobility    Modified Rankin (Stroke Patients Only)       Balance                                            Cognition Arousal/Alertness: Lethargic                                     General Comments: lethargic with brief moments of one word or facial expressions      Exercises      General Comments        Pertinent Vitals/Pain Pain Assessment: Faces Faces Pain Scale: Hurts a little bit Pain Location: back side Pain Descriptors / Indicators: Discomfort;Grimacing Pain Intervention(s): Monitored during session;Repositioned    Home Living                      Prior Function            PT Goals (current goals can now be found in the care plan section) Progress towards PT goals: Progressing toward goals    Frequency    Min 1X/week      PT Plan Current plan remains appropriate  Co-evaluation              AM-PAC PT "6 Clicks" Mobility   Outcome Measure  Help needed turning from your back to your side while in a flat bed without using bedrails?: Total Help needed moving from lying on your back to sitting on the side of a flat bed without using bedrails?: Total Help needed moving to and from a bed to a chair (including a wheelchair)?: Total Help needed standing up from a chair using your arms (e.g., wheelchair or bedside chair)?: Total Help needed to walk in hospital room?: Total Help needed climbing 3-5 steps with a railing? : Total 6 Click Score: 6    End of Session Equipment Utilized During Treatment: Gait belt Activity Tolerance: Other (comment)(lethargic) Patient left: in chair;with chair alarm set;with bed alarm set Nurse Communication: Mobility status PT Visit Diagnosis: Muscle weakness (generalized) (M62.81);Other abnormalities of gait and mobility (R26.89)     Time: 0071-2197 PT Time Calculation (min) (ACUTE  ONLY): 19 min  Charges:  $Therapeutic Activity: 8-22 mins                     Felecia Shelling  PTA Acute  Rehabilitation Services Pager      [phone] Office      [phone]

## 2019-07-15 NOTE — Progress Notes (Signed)
MEWS yellow due to pulse - not an acute change, has been tachy at times.  will continue to monitor

## 2019-07-15 NOTE — Progress Notes (Signed)
Patients purewick empty since earlier this AM, order to I&O cath.  Bladder scan showing 360 cc before cath done.  500 cc of amber urine drained with I&O.  MD made aware

## 2019-07-15 NOTE — Progress Notes (Signed)
Physical Therapy Treatment Patient Details Name: Alicia Benitez MRN: [id number] DOB: 03-27-1936 Today's Date: 07/15/2019    History of Present Illness 83 y.o. female with medical history significant for advanced dementia from memory care unit, hypothyroidism, hypertension, anxiety was brought from memory care unit with complaints of reported fever and diarrhea    PT Comments    On arrival pt was awake and talkative.  However she did not recall [place] helping her OOB earlier.  Assisted out of recliner to [facility] required + 2 Total Assist.  General transfer comment: attempted + 2 side by side and 100% VC's on proper hand placement/proper hand transfer however pt "gripping" and "pulling up" on BSC.  Assisted back to bed.  Per chart review, pt plans to return to [facility] Unit.  Pt will need 24/7 Total Care.     Follow Up Recommendations  SNF     Equipment Recommendations  None recommended by PT    Recommendations for Other Services       Precautions / Restrictions Precautions Precautions: Fall Precaution Comments: Hx Dementia Restrictions Weight Bearing Restrictions: No    Mobility  Bed Mobility Overal bed mobility: Needs Assistance Bed Mobility: Sit to Supine   Sidelying to sit: Total assist Supine to sit: Total assist(0%) Sit to supine: Total assist;+2 for physical assistance;+2 for safety/equipment Sit to sidelying: Total assist General bed mobility comments: assisted back to bed + 2 and positioned sidelying  Transfers Overall transfer level: Needs assistance Equipment used: None;Rolling walker (2 wheeled) Transfers: Sit to/from [employer] Transfers Sit to Stand: +2 safety/equipment;+2 physical assistance;Total assist;Max assist Stand pivot transfers: Total assist;+2 physical assistance;+2 safety/equipment       General transfer comment: attempted + 2 side by side and 100% VC's on proper hand placement/proper hand transfer however pt "gripping" and  "pulling up" on BSC.  Ambulation/Gait             General Gait Details: unable   Stairs             Wheelchair Mobility    Modified Rankin (Stroke Patients Only)       Balance                                            Cognition Arousal/Alertness: Awake/alert                                     General Comments: awake and unmemorable that we got her OOB earlier      Exercises      General Comments        Pertinent Vitals/Pain Pain Assessment: Faces Faces Pain Scale: Hurts a little bit Pain Location: back side Pain Descriptors / Indicators: Discomfort;Grimacing Pain Intervention(s): Monitored during session;Repositioned    Home Living                      Prior Function            PT Goals (current goals can now be found in the care plan section) Progress towards PT goals: Progressing toward goals    Frequency    Min 1X/week      PT Plan Current plan remains appropriate    Co-evaluation  AM-PAC PT "6 Clicks" Mobility   Outcome Measure  Help needed turning from your back to your side while in a flat bed without using bedrails?: Total Help needed moving from lying on your back to sitting on the side of a flat bed without using bedrails?: Total Help needed moving to and from a bed to a chair (including a wheelchair)?: Total Help needed standing up from a chair using your arms (e.g., wheelchair or bedside chair)?: Total Help needed to walk in hospital room?: Total Help needed climbing 3-5 steps with a railing? : Total 6 Click Score: 6    End of Session Equipment Utilized During Treatment: Gait belt Activity Tolerance: Treatment limited secondary to agitation Patient left: in bed;with call bell/phone within reach;with bed alarm set Nurse Communication: Mobility status PT Visit Diagnosis: Muscle weakness (generalized) (M62.81);Other abnormalities of gait and mobility  (R26.89)     Time: 1442-1510 PT Time Calculation (min) (ACUTE ONLY): 28 min  Charges:  $Therapeutic Activity: 23-37 mins                     Felecia Shelling  PTA Acute  Rehabilitation Services Pager      [phone] Office      [phone]

## 2019-07-16 ENCOUNTER — Other Ambulatory Visit: Payer: Self-pay

## 2019-07-16 ENCOUNTER — Inpatient Hospital Stay (HOSPITAL_COMMUNITY): Payer: Medicare Other

## 2019-07-16 DIAGNOSIS — R197 Diarrhea, unspecified: Secondary | ICD-10-CM | POA: Diagnosis not present

## 2019-07-16 LAB — CBC WITH DIFFERENTIAL/PLATELET
Abs Immature Granulocytes: 0.04 10*3/uL (ref 0.00–0.07)
Basophils Absolute: 0 10*3/uL (ref 0.0–0.1)
Basophils Relative: 0 %
Eosinophils Absolute: 0 10*3/uL (ref 0.0–0.5)
Eosinophils Relative: 0 %
HCT: 26.5 % — ABNORMAL LOW (ref 36.0–46.0)
Hemoglobin: 8.2 g/dL — ABNORMAL LOW (ref 12.0–15.0)
Immature Granulocytes: 0 %
Lymphocytes Relative: 13 %
Lymphs Abs: 1.5 10*3/uL (ref 0.7–4.0)
MCH: 29.2 pg (ref 26.0–34.0)
MCHC: 30.9 g/dL (ref 30.0–36.0)
MCV: 94.3 fL (ref 80.0–100.0)
Monocytes Absolute: 0.5 10*3/uL (ref 0.1–1.0)
Monocytes Relative: 5 %
Neutro Abs: 9.2 10*3/uL — ABNORMAL HIGH (ref 1.7–7.7)
Neutrophils Relative %: 82 %
Platelets: 308 10*3/uL (ref 150–400)
RBC: 2.81 MIL/uL — ABNORMAL LOW (ref 3.87–5.11)
RDW: 15.6 % — ABNORMAL HIGH (ref 11.5–15.5)
WBC: 11.4 10*3/uL — ABNORMAL HIGH (ref 4.0–10.5)
nRBC: 0 % (ref 0.0–0.2)

## 2019-07-16 LAB — COMPREHENSIVE METABOLIC PANEL
ALT: 7 U/L (ref 0–44)
AST: 12 U/L — ABNORMAL LOW (ref 15–41)
Albumin: 1.9 g/dL — ABNORMAL LOW (ref 3.5–5.0)
Alkaline Phosphatase: 66 U/L (ref 38–126)
Anion gap: 9 (ref 5–15)
BUN: 33 mg/dL — ABNORMAL HIGH (ref 8–23)
CO2: 21 mmol/L — ABNORMAL LOW (ref 22–32)
Calcium: 8.2 mg/dL — ABNORMAL LOW (ref 8.9–10.3)
Chloride: 106 mmol/L (ref 98–111)
Creatinine, Ser: 0.75 mg/dL (ref 0.44–1.00)
GFR calc Af Amer: >60 mL/min (ref >60)
GFR calc non Af Amer: >60 mL/min (ref >60)
Glucose, Bld: 126 mg/dL — ABNORMAL HIGH (ref 70–99)
Potassium: 3.5 mmol/L (ref 3.5–5.1)
Sodium: 136 mmol/L (ref 135–145)
Total Bilirubin: 0.6 mg/dL (ref 0.3–1.2)
Total Protein: 4.9 g/dL — ABNORMAL LOW (ref 6.5–8.1)

## 2019-07-16 LAB — GLUCOSE, CAPILLARY
Glucose-Capillary: 101 mg/dL — ABNORMAL HIGH (ref 70–99)
Glucose-Capillary: 106 mg/dL — ABNORMAL HIGH (ref 70–99)
Glucose-Capillary: 108 mg/dL — ABNORMAL HIGH (ref 70–99)

## 2019-07-16 LAB — LACTIC ACID, PLASMA: Lactic Acid, Venous: 1.6 mmol/L (ref 0.5–1.9)

## 2019-07-16 LAB — BASIC METABOLIC PANEL
Anion gap: 8 (ref 5–15)
BUN: 33 mg/dL — ABNORMAL HIGH (ref 8–23)
CO2: 24 mmol/L (ref 22–32)
Calcium: 8.2 mg/dL — ABNORMAL LOW (ref 8.9–10.3)
Chloride: 107 mmol/L (ref 98–111)
Creatinine, Ser: 0.78 mg/dL (ref 0.44–1.00)
GFR calc Af Amer: >60 mL/min (ref >60)
GFR calc non Af Amer: >60 mL/min (ref >60)
Glucose, Bld: 113 mg/dL — ABNORMAL HIGH (ref 70–99)
Potassium: 3.7 mmol/L (ref 3.5–5.1)
Sodium: 139 mmol/L (ref 135–145)

## 2019-07-16 LAB — LIPASE, BLOOD: Lipase: 23 U/L (ref 11–51)

## 2019-07-16 LAB — MAGNESIUM
Magnesium: 2.2 mg/dL (ref 1.7–2.4)
Magnesium: 2 mg/dL (ref 1.7–2.4)

## 2019-07-16 LAB — OCCULT BLOOD X 1 CARD TO LAB, STOOL: Fecal Occult Bld: POSITIVE — AB

## 2019-07-16 MED ORDER — LACTATED RINGERS IV SOLN
INTRAVENOUS | Status: DC
  Administered 2019-07-16 – 2019-07-19 (×5): via INTRAVENOUS

## 2019-07-16 MED ORDER — SODIUM CHLORIDE 0.9 % IV BOLUS
250.0000 mL | Freq: Once | INTRAVENOUS | Status: AC
  Administered 2019-07-16: 250 mL via INTRAVENOUS

## 2019-07-16 MED ORDER — SORBITOL 70 % SOLN
400.0000 mL | TOPICAL_OIL | Freq: Once | ORAL | Status: AC
  Administered 2019-07-16: 400 mL via RECTAL
  Filled 2019-07-16: qty 120

## 2019-07-16 MED ORDER — BISACODYL 10 MG RE SUPP
10.0000 mg | Freq: Once | RECTAL | Status: AC
  Administered 2019-07-16: 10 mg via RECTAL
  Filled 2019-07-16: qty 1

## 2019-07-16 NOTE — Progress Notes (Signed)
[facility] Progress Note  Patient: Alicia Benitez ONG:[id number]   PCP: Margit Hanks, MD DOB: 1936-06-07   DOA: 07/19/2019   DOS: 07/16/2019   Date of Service: the patient was seen and examined on 07/16/2019  Chief Complaint  Patient presents with  . Diarrhea   Brief hospital course: Kemyah Buser is a 83 y.o. femalewith medical history significant foradvanced dementia from memory care unit, hypothyroidism, hypertension, anxiety. Patient presented from memory care unit secondary to diarrhea and found to have fecal impaction  Currently further plan is continue manual disimpaction as well as enema.  Subjective: Patient denies any acute complaint but had an episode of vomiting this morning.  No nausea.  Also has abdominal tenderness on exam but denies any abdominal pain.  Not passing gas.  No shortness of breath or chest pain.  Assessment and Plan: 1.  Diarrhea Fecal impaction Abdominal distention X-ray on 07/16/2019 shows severe rectal sigmoid stool burden with dilated bowels concerning for fecal impaction. Dulcolax suppository ordered with large BM. Some pinkish hue on the periphery of the stool FOBT positive. Smog enema with minimal improvement. Manual disimpaction with some benefit. Continue clear liquid diet only for now protection monitor CBC. Holding oral bowel regimen and using as needed Dulcolax and enema only.  2.  AKI Dehydration Hypertension Sinus tachycardia Continue with IV fluids per Patient received IV fluid bolus this morning. Holding antihypertensive regimen. Monitor on telemetry  3.  E. coli UTI Treated with oral amoxicillin.  Monitor.  4.  Hypothyroidism. Continue Synthroid.  5.  NSVT. Monitor on telemetry. Check magnesium. Replaced aggressively this morning. If persistent or has frequent PVCs we will get echocardiogram.  6.  Anemia. Etiology not clear. H&H relatively stable since admission. FOBT positive but likely from rectal  mucosal injury from hard stool. Monitor for now. Clear liquid diet for now. Discontinue aspirin and DVT prophylaxis.  Body mass index is 18.08 kg/m.  Nutrition Problem: Increased nutrient needs Etiology: wound healing Interventions: Interventions: Ensure Enlive (each supplement provides 350kcal and 20 grams of protein), Magic cup, MVI, Prostat    Left hip deep tissue injury. Please arrange admission. Wound care consulted. Continue frequent changes on foam dressing.  Pressure Injury 07/14/2019 Hip Left Deep Tissue Injury - Purple or maroon localized area of discolored intact skin or blood-filled blister due to damage of underlying soft tissue from pressure and/or shear. (Active)  06/28/2019 1342  Location: Hip  Location Orientation: Left  Staging: Deep Tissue Injury - Purple or maroon localized area of discolored intact skin or blood-filled blister due to damage of underlying soft tissue from pressure and/or shear.  Wound Description (Comments):   Present on Admission: Yes     Pressure Injury 07/02/2019 Hip Left Stage II -  Partial thickness loss of dermis presenting as a shallow open ulcer with a red, pink wound bed without slough. (Active)  07/08/2019 1344  Location: Hip  Location Orientation: Left  Staging: Stage II -  Partial thickness loss of dermis presenting as a shallow open ulcer with a red, pink wound bed without slough.  Wound Description (Comments):   Present on Admission: Yes     Pressure Injury 07/22/2019 Ischial tuberosity Left Unstageable - Full thickness tissue loss in which the base of the ulcer is covered by slough (yellow, tan, gray, green or brown) and/or eschar (tan, brown or black) in the wound bed. (Active)  07/07/2019 1346  Location: Ischial tuberosity  Location Orientation: Left  Staging: Unstageable - Full thickness tissue loss in  which the base of the ulcer is covered by slough (yellow, tan, gray, green or brown) and/or eschar (tan, brown or black) in the wound  bed.  Wound Description (Comments):   Present on Admission: Yes     Diet: Cardiac diet  DVT Prophylaxis: SCD, pharmacological prophylaxis contraindicated due to Positive FOBT  Advance goals of care discussion: Full code  Family Communication: no family was present at bedside, at the time of interview.   Disposition:  Discharge to SNF when medically stable.  Consultants: none Procedures: noen  Scheduled Meds: . amoxicillin  500 mg Oral Q8H  . divalproex  125 mg Oral QHS  . famotidine  10 mg Oral Daily  . feeding supplement (ENSURE ENLIVE)  237 mL Oral BID BM  . feeding supplement (PRO-STAT SUGAR FREE 64)  30 mL Oral BID  . levothyroxine  88 mcg Oral QAC breakfast  . mouth rinse  15 mL Mouth Rinse BID  . mirtazapine  7.5 mg Oral QHS  . multivitamin with minerals  1 tablet Oral Daily  . PARoxetine  10 mg Oral Daily  . sorbitol, milk of mag, mineral oil, glycerin (SMOG) enema  400 mL Rectal Once   Continuous Infusions: PRN Meds: acetaminophen **OR** acetaminophen, albuterol, HYDROcodone-acetaminophen, iohexol, ondansetron **OR** ondansetron (ZOFRAN) IV Antibiotics: Anti-infectives (From admission, onward)   Start     Dose/Rate Route Frequency Ordered Stop   07/14/19 0600  amoxicillin (AMOXIL) capsule 500 mg     500 mg Oral Every 8 hours 07/13/19 1158     07/12/19 1200  cefTRIAXone (ROCEPHIN) 1 g in sodium chloride 0.9 % 100 mL IVPB     1 g 200 mL/hr over 30 Minutes Intravenous Every 24 hours 07/12/19 1103 07/13/19 1418       Objective: Physical Exam: Vitals:   07/16/19 1026 07/16/19 1125 07/16/19 1235 07/16/19 1618  BP: 105/71 110/68 (!) 96/57 107/70  Pulse: (!) 110 87 95 100  Resp: 19 20 20 19   Temp: 97.6 F (36.4 C) 98.7 F (37.1 C) 98 F (36.7 C) 98.5 F (36.9 C)  TempSrc: Oral Oral Oral Oral  SpO2: 97% 97% 97% 99%  Weight:      Height:        Intake/Output Summary (Last 24 hours) at 07/16/2019 1701 Last data filed at 07/16/2019 0600 Gross per 24 hour   Intake 140 ml  Output 50 ml  Net 90 ml   Filed Weights   12-Jan-2019 1311 07/14/19 0534  Weight: 49 kg 50.8 kg   General: alert and oriented to time, place, and person. Appear in moderate distress, affect appropriate Eyes: PERRL, Conjunctiva normal ENT: Oral Mucosa Clear, dry  Neck: no JVD, no Abnormal Mass Or lumps Cardiovascular: S1 and S2 Present, no Murmur, peripheral pulses symmetrical Respiratory: good respiratory effort, Bilateral Air entry equal and Decreased, no signs of accessory muscle use, Clear to Auscultation, no Crackles, no wheezes Abdomen: Bowel Sound present, Soft and distended, mild tenderness, no hernia Skin: no rashes  Extremities: no Pedal edema, no calf tenderness Neurologic: without any new focal findings Gait not checked due to patient safety concerns  Data Reviewed: I have personally reviewed and interpreted daily labs, tele strips, imagings as discussed above. I reviewed all nursing notes, pharmacy notes, vitals, pertinent old records I have discussed plan of care as described above with RN and patient/family.  CBC: Recent Labs  Lab 07/10/19 0343 07/11/19 0912 07/13/19 1201 07/15/19 1157  WBC 6.4 7.3 6.7 8.3  HGB 8.9* 8.8* 9.9*  8.2*  HCT 28.3* 28.0* 31.6* 26.0*  MCV 94.0 95.6 94.0 94.2  PLT 203 174 232 373   Basic Metabolic Panel: Recent Labs  Lab 07/10/19 0343 07/11/19 0912 07/13/19 1201 07/15/19 1157 07/16/19 0820  NA 139 142 139 138 136  K 2.7* 3.6 3.0* 3.5 3.5  CL 112* 115* 105 106 106  CO2 18* 20* 26 25 21*  GLUCOSE 96 93 118* 132* 126*  BUN 48* 25* 13 26* 33*  CREATININE 1.05* 0.67 0.60 0.51 0.75  CALCIUM 7.4* 8.1* 7.9* 7.9* 8.2*  MG  --   --   --   --  2.2    Liver Function Tests: Recent Labs  Lab 07/10/19 0343 07/15/19 1157 07/16/19 0820  AST 11* 9* 12*  ALT 7 7 7   ALKPHOS 50 60 66  BILITOT 0.7 0.4 0.6  PROT 4.5* 4.4* 4.9*  ALBUMIN 2.1* 1.7* 1.9*   Recent Labs  Lab 07/16/19 0820  LIPASE 23   No results for  input(s): AMMONIA in the last 168 hours. Coagulation Profile: No results for input(s): INR, PROTIME in the last 168 hours. Cardiac Enzymes: No results for input(s): CKTOTAL, CKMB, CKMBINDEX, TROPONINI in the last 168 hours. BNP (last 3 results) No results for input(s): PROBNP in the last 8760 hours. CBG: Recent Labs  Lab 07/15/19 0733 07/15/19 1625 07/15/19 2353 07/16/19 0827 07/16/19 1615  GLUCAP 111* 122* 105* 106* 108*   Studies: Dg Abd Portable 1v  Result Date: 07/16/2019 CLINICAL DATA:  Abdominal distension, nausea and vomiting this morning. EXAM: PORTABLE ABDOMEN - 1 VIEW COMPARISON:  Single-view of the abdomen 07/15/2019. CT abdomen and pelvis 07/11/19. FINDINGS: There is gaseous distention of the colon. A massive volume of stool and is again seen in the rectosigmoid colon. Small bowel is unremarkable. IMPRESSION: Massive volume of stool in the rectosigmoid colon. Gaseous distention of the colon is worrisome for secondary obstruction or ileus. Electronically Signed   By: Inge Rise M.D.   On: 07/16/2019 08:46     Time spent: 35 minutes  Author: Berle Mull, MD [facility] 07/16/2019 5:01 PM  To reach On-call, see care teams to locate the attending and reach out to them via [email or web address]. If 7PM-7AM, please contact night-coverage If you still have difficulty reaching the attending provider, please page the [facility] (Director on Call) for [facility] on amion for assistance.

## 2019-07-16 NOTE — Progress Notes (Signed)
During bedside report pt with new complaints of nausea. Pt with one occurrence of moderate brown emesis. Abd still distended unchanged from previous assessment. BP dropped to 90/62. Pt states "I just feel lousy." Pt is now a red MEWS. Protocol initiated. Oncoming RN Willene Hatchet made aware of acute change. MD Posey Pronto paged.

## 2019-07-16 NOTE — Progress Notes (Signed)
Pt had 32 beat run of SVT. Asymptomatic, VSS. Now NSR 96. MD Posey Pronto paged to be made aware.

## 2019-07-16 NOTE — Progress Notes (Addendum)
Patient alert to self only, denies nausea at this time. No further vomiting so far, only once during report. VS improving. MD Posey Pronto did assess patient at bedside. Pt appears uncomfortable but not in distress. Abdomen very firm/hard to the touch. EKG done showing NSR with PACs. Bladder scan showed only 63 ml in bladder. Pt had a medium, brown, formed BM- incontinent in bed. BM smells like possible GI bleed with pink ring stain on bed pad. MD aware & occult stool sent. 1L NS bolus infusing at 999/hr per MD Posey Pronto. RR & CN aware of red MEWS. Will continue to monitor closely.

## 2019-07-16 NOTE — Progress Notes (Addendum)
SLP Cancellation Note  Patient Details Name: Alicia Benitez MRN: [id number] DOB: January 11, 1936   Cancelled treatment:       Reason Eval/Treat Not Completed: Medical issues which prohibited therapy(note concerns for possible GI issues, ? Ileus or obstruction per imaging,  will continue efforts)   Macario Golds 07/16/2019, 2:50 PM  Luanna Salk, [facility] SLP Acute Rehab Services Pager [phone] Office [phone]

## 2019-07-17 ENCOUNTER — Inpatient Hospital Stay (HOSPITAL_COMMUNITY): Payer: Medicare Other

## 2019-07-17 DIAGNOSIS — R197 Diarrhea, unspecified: Secondary | ICD-10-CM | POA: Diagnosis not present

## 2019-07-17 LAB — CBC WITH DIFFERENTIAL/PLATELET
Abs Immature Granulocytes: 0.07 10*3/uL (ref 0.00–0.07)
Basophils Absolute: 0.1 10*3/uL (ref 0.0–0.1)
Basophils Relative: 0 %
Eosinophils Absolute: 0 10*3/uL (ref 0.0–0.5)
Eosinophils Relative: 0 %
HCT: 27.7 % — ABNORMAL LOW (ref 36.0–46.0)
Hemoglobin: 8.4 g/dL — ABNORMAL LOW (ref 12.0–15.0)
Immature Granulocytes: 1 %
Lymphocytes Relative: 12 %
Lymphs Abs: 1.3 10*3/uL (ref 0.7–4.0)
MCH: 29.3 pg (ref 26.0–34.0)
MCHC: 30.3 g/dL (ref 30.0–36.0)
MCV: 96.5 fL (ref 80.0–100.0)
Monocytes Absolute: 0.3 10*3/uL (ref 0.1–1.0)
Monocytes Relative: 3 %
Neutro Abs: 9.5 10*3/uL — ABNORMAL HIGH (ref 1.7–7.7)
Neutrophils Relative %: 84 %
Platelets: 321 10*3/uL (ref 150–400)
RBC: 2.87 MIL/uL — ABNORMAL LOW (ref 3.87–5.11)
RDW: 15.9 % — ABNORMAL HIGH (ref 11.5–15.5)
WBC: 11.3 10*3/uL — ABNORMAL HIGH (ref 4.0–10.5)
nRBC: 0 % (ref 0.0–0.2)

## 2019-07-17 LAB — COMPREHENSIVE METABOLIC PANEL
ALT: 12 U/L (ref 0–44)
AST: 18 U/L (ref 15–41)
Albumin: 2 g/dL — ABNORMAL LOW (ref 3.5–5.0)
Alkaline Phosphatase: 70 U/L (ref 38–126)
Anion gap: 10 (ref 5–15)
BUN: 39 mg/dL — ABNORMAL HIGH (ref 8–23)
CO2: 23 mmol/L (ref 22–32)
Calcium: 8.6 mg/dL — ABNORMAL LOW (ref 8.9–10.3)
Chloride: 108 mmol/L (ref 98–111)
Creatinine, Ser: 0.87 mg/dL (ref 0.44–1.00)
GFR calc Af Amer: >60 mL/min (ref >60)
GFR calc non Af Amer: >60 mL/min (ref >60)
Glucose, Bld: 126 mg/dL — ABNORMAL HIGH (ref 70–99)
Potassium: 3.7 mmol/L (ref 3.5–5.1)
Sodium: 141 mmol/L (ref 135–145)
Total Bilirubin: 0.8 mg/dL (ref 0.3–1.2)
Total Protein: 5.1 g/dL — ABNORMAL LOW (ref 6.5–8.1)

## 2019-07-17 LAB — LACTIC ACID, PLASMA: Lactic Acid, Venous: 1.9 mmol/L (ref 0.5–1.9)

## 2019-07-17 LAB — MAGNESIUM: Magnesium: 2.3 mg/dL (ref 1.7–2.4)

## 2019-07-17 LAB — GLUCOSE, CAPILLARY
Glucose-Capillary: 97 mg/dL (ref 70–99)
Glucose-Capillary: 81 mg/dL (ref 70–99)

## 2019-07-17 MED ORDER — SIMETHICONE 40 MG/0.6ML PO SUSP
40.0000 mg | Freq: Four times a day (QID) | ORAL | Status: DC
  Administered 2019-07-17 – 2019-07-19 (×9): 40 mg via ORAL
  Filled 2019-07-17 (×15): qty 0.6

## 2019-07-17 MED ORDER — POLYETHYLENE GLYCOL 3350 17 G PO PACK
17.0000 g | PACK | Freq: Two times a day (BID) | ORAL | Status: DC
  Administered 2019-07-17 – 2019-07-18 (×3): 17 g via ORAL
  Filled 2019-07-17 (×3): qty 1

## 2019-07-17 NOTE — Progress Notes (Signed)
SLP Cancellation Note  Patient Details Name: Alicia Benitez MRN: [id number] DOB: 1935-10-23   Cancelled treatment:       Reason Eval/Treat Not Completed: (pt needed to be cleaned up)   Macario Golds 07/17/2019, 3:34 PM  Luanna Salk, [facility] SLP Acute Rehab Services Pager [phone] Office [phone]

## 2019-07-17 NOTE — Progress Notes (Signed)
[facility] Progress Note  Patient: Alicia Benitez BJS:[id number]   PCP: Margit Hanks, MD DOB: [birth year]   DOA: 06/29/2019   DOS: 07/17/2019   Date of Service: the patient was seen and examined on 07/17/2019  Chief Complaint  Patient presents with  . Diarrhea   Brief hospital course: Alicia Benitez is a 83 y.o. femalewith medical history significant foradvanced dementia from memory care unit, hypothyroidism, hypertension, anxiety. Patient presented from memory care unit secondary to diarrhea and found to have fecal impaction  Currently further plan is continue manual disimpaction as well as enema.  Subjective: had a BM without blood and no nausea, tolerating liquid. No fever chills.   Assessment and Plan: 1.  Diarrhea Fecal impaction Abdominal distention X-ray on 07/16/2019 shows severe rectal sigmoid stool burden with dilated bowels concerning for fecal impaction. Dulcolax suppository ordered with large BM. Some pinkish hue on the periphery of the stool FOBT positive. Smog enema with minimal improvement. Manual disimpaction with some benefit. Continue dys 3 diet. Resume oral bowel regimen.  Add simethicone.   2.  AKI Dehydration Hypertension Sinus tachycardia Continue with IV fluids reduce fluids.  Holding antihypertensive regimen. Monitor on telemetry  3.  E. coli UTI Treated with oral amoxicillin.  Monitor.  4.  Hypothyroidism. Continue Synthroid.  5.  NSVT. Monitor on telemetry. Check magnesium. Replaced aggressively If persistent or has frequent PVCs we will get echocardiogram.  6.  Anemia. Etiology not clear. H&H relatively stable since admission. FOBT positive but likely from rectal mucosal injury from hard stool. Monitor for now. Hold aspirin and DVT prophylaxis.  Body mass index is 18.08 kg/m.  Nutrition Problem: Increased nutrient needs Etiology: wound healing Interventions: Interventions: Ensure Enlive (each supplement provides  350kcal and 20 grams of protein), Magic cup, MVI, Prostat   Left hip deep tissue injury. Please arrange admission. Wound care consulted. Continue frequent changes on foam dressing.  Pressure Injury 07/14/2019 Hip Left Deep Tissue Injury - Purple or maroon localized area of discolored intact skin or blood-filled blister due to damage of underlying soft tissue from pressure and/or shear. (Active)  07/02/2019 1342  Location: Hip  Location Orientation: Left  Staging: Deep Tissue Injury - Purple or maroon localized area of discolored intact skin or blood-filled blister due to damage of underlying soft tissue from pressure and/or shear.  Wound Description (Comments):   Present on Admission: Yes     Pressure Injury 07/17/2019 Hip Left Stage II -  Partial thickness loss of dermis presenting as a shallow open ulcer with a red, pink wound bed without slough. (Active)  07/22/2019 1344  Location: Hip  Location Orientation: Left  Staging: Stage II -  Partial thickness loss of dermis presenting as a shallow open ulcer with a red, pink wound bed without slough.  Wound Description (Comments):   Present on Admission: Yes     Pressure Injury 07/01/2019 Ischial tuberosity Left Unstageable - Full thickness tissue loss in which the base of the ulcer is covered by slough (yellow, tan, gray, green or brown) and/or eschar (tan, brown or black) in the wound bed. (Active)  07/24/2019 1346  Location: Ischial tuberosity  Location Orientation: Left  Staging: Unstageable - Full thickness tissue loss in which the base of the ulcer is covered by slough (yellow, tan, gray, green or brown) and/or eschar (tan, brown or black) in the wound bed.  Wound Description (Comments):   Present on Admission: Yes    Diet: Cardiac diet  DVT Prophylaxis: SCD, pharmacological prophylaxis contraindicated  due to Positive FOBT  Advance goals of care discussion: Full code  Family Communication: no family was present at bedside, at the time of  interview.   Disposition:  Discharge to SNF when medically stable.  Consultants: none Procedures: noen  Scheduled Meds: . amoxicillin  500 mg Oral Q8H  . divalproex  125 mg Oral QHS  . famotidine  10 mg Oral Daily  . feeding supplement (ENSURE ENLIVE)  237 mL Oral BID BM  . feeding supplement (PRO-STAT SUGAR FREE 64)  30 mL Oral BID  . levothyroxine  88 mcg Oral QAC breakfast  . mouth rinse  15 mL Mouth Rinse BID  . mirtazapine  7.5 mg Oral QHS  . multivitamin with minerals  1 tablet Oral Daily  . PARoxetine  10 mg Oral Daily  . polyethylene glycol  17 g Oral BID  . simethicone  40 mg Oral QID   Continuous Infusions: . lactated ringers 50 mL/hr at 07/17/19 1608   PRN Meds: acetaminophen **OR** acetaminophen, albuterol, HYDROcodone-acetaminophen, iohexol, ondansetron **OR** ondansetron (ZOFRAN) IV Antibiotics: Anti-infectives (From admission, onward)   Start     Dose/Rate Route Frequency Ordered Stop   07/14/19 0600  amoxicillin (AMOXIL) capsule 500 mg     500 mg Oral Every 8 hours 07/13/19 1158     07/12/19 1200  cefTRIAXone (ROCEPHIN) 1 g in sodium chloride 0.9 % 100 mL IVPB     1 g 200 mL/hr over 30 Minutes Intravenous Every 24 hours 07/12/19 1103 07/13/19 1418       Objective: Physical Exam: Vitals:   07/16/19 2009 07/16/19 2355 07/17/19 0344 07/17/19 1242  BP: 116/68 111/67 118/70 119/66  Pulse: 95 96 93 100  Resp: 18 18 15  (!) 21  Temp: 97.8 F (36.6 C) 97.8 F (36.6 C) 98.7 F (37.1 C) 98.2 F (36.8 C)  TempSrc: Oral Oral Oral Oral  SpO2: 96% 96%  100%  Weight:      Height:        Intake/Output Summary (Last 24 hours) at 07/17/2019 1910 Last data filed at 07/17/2019 1600 Gross per 24 hour  Intake 1657.57 ml  Output 0 ml  Net 1657.57 ml   Filed Weights   06/27/2019 1311 07/14/19 0534  Weight: 49 kg 50.8 kg   General: alert and oriented to time, place, and person. Appear in moderate distress, affect appropriate Eyes: PERRL, Conjunctiva normal ENT:  Oral Mucosa Clear, dry  Neck: no JVD, no Abnormal Mass Or lumps Cardiovascular: S1 and S2 Present, no Murmur, peripheral pulses symmetrical Respiratory: good respiratory effort, Bilateral Air entry equal and Decreased, no signs of accessory muscle use, Clear to Auscultation, no Crackles, no wheezes Abdomen: Bowel Sound present, Soft and distended, mild tenderness, no hernia Skin: no rashes  Extremities: no Pedal edema, no calf tenderness Neurologic: without any new focal findings Gait not checked due to patient safety concerns  Data Reviewed: I have personally reviewed and interpreted daily labs, tele strips, imagings as discussed above. I reviewed all nursing notes, pharmacy notes, vitals, pertinent old records I have discussed plan of care as described above with RN and patient/family.  CBC: Recent Labs  Lab 07/11/19 0912 07/13/19 1201 07/15/19 1157 07/16/19 1650 07/17/19 0354  WBC 7.3 6.7 8.3 11.4* 11.3*  NEUTROABS  --   --   --  9.2* 9.5*  HGB 8.8* 9.9* 8.2* 8.2* 8.4*  HCT 28.0* 31.6* 26.0* 26.5* 27.7*  MCV 95.6 94.0 94.2 94.3 96.5  PLT 174 232 249 308 321  Basic Metabolic Panel: Recent Labs  Lab 07/13/19 1201 07/15/19 1157 07/16/19 0820 07/16/19 1650 07/17/19 0354  NA 139 138 136 139 141  K 3.0* 3.5 3.5 3.7 3.7  CL 105 106 106 107 108  CO2 26 25 21* 24 23  GLUCOSE 118* 132* 126* 113* 126*  BUN 13 26* 33* 33* 39*  CREATININE 0.60 0.51 0.75 0.78 0.87  CALCIUM 7.9* 7.9* 8.2* 8.2* 8.6*  MG  --   --  2.2 2.0 2.3    Liver Function Tests: Recent Labs  Lab 07/15/19 1157 07/16/19 0820 07/17/19 0354  AST 9* 12* 18  ALT 7 7 12   ALKPHOS 60 66 70  BILITOT 0.4 0.6 0.8  PROT 4.4* 4.9* 5.1*  ALBUMIN 1.7* 1.9* 2.0*   Recent Labs  Lab 07/16/19 0820  LIPASE 23   No results for input(s): AMMONIA in the last 168 hours. Coagulation Profile: No results for input(s): INR, PROTIME in the last 168 hours. Cardiac Enzymes: No results for input(s): CKTOTAL, CKMB,  CKMBINDEX, TROPONINI in the last 168 hours. BNP (last 3 results) No results for input(s): PROBNP in the last 8760 hours. CBG: Recent Labs  Lab 07/16/19 0827 07/16/19 1615 07/16/19 2356 07/17/19 0728 07/17/19 1553  GLUCAP 106* 108* 101* 97 81   Studies: Dg Abd Portable 1v  Result Date: 07/17/2019 CLINICAL DATA:  Constipation. EXAM: PORTABLE ABDOMEN - 1 VIEW COMPARISON:  Radiograph yesterday. CT Jul 29, 2019 FINDINGS: Large rectosigmoid stool burden, however slight decrease from yesterday. There is unchanged gaseous distention of colon. No small bowel dilatation. No evidence of free air. IMPRESSION: 1. Large rectosigmoid stool burden, however slight decrease from yesterday. 2. Unchanged gaseous colonic distension. Electronically Signed   By: Keith Rake M.D.   On: 07/17/2019 05:57     Time spent: 35 minutes  Author: Berle Mull, MD [facility] 07/17/2019 7:10 PM  To reach On-call, see care teams to locate the attending and reach out to them via [email or web address]. If 7PM-7AM, please contact night-coverage If you still have difficulty reaching the attending provider, please page the [facility] (Director on Call) for [facility] on amion for assistance.

## 2019-07-18 ENCOUNTER — Inpatient Hospital Stay (HOSPITAL_COMMUNITY): Payer: Medicare Other

## 2019-07-18 DIAGNOSIS — R197 Diarrhea, unspecified: Secondary | ICD-10-CM | POA: Diagnosis not present

## 2019-07-18 LAB — GLUCOSE, CAPILLARY
Glucose-Capillary: 130 mg/dL — ABNORMAL HIGH (ref 70–99)
Glucose-Capillary: 80 mg/dL (ref 70–99)
Glucose-Capillary: 87 mg/dL (ref 70–99)

## 2019-07-18 MED ORDER — SORBITOL 70 % SOLN
960.0000 mL | TOPICAL_OIL | Freq: Once | ORAL | Status: AC
  Administered 2019-07-19: 960 mL via RECTAL
  Filled 2019-07-18: qty 473

## 2019-07-18 MED ORDER — ENOXAPARIN SODIUM 30 MG/0.3ML ~~LOC~~ SOLN
30.0000 mg | SUBCUTANEOUS | Status: DC
  Administered 2019-07-18 – 2019-07-19 (×2): 30 mg via SUBCUTANEOUS
  Filled 2019-07-18 (×2): qty 0.3

## 2019-07-18 NOTE — Progress Notes (Signed)
[facility] Progress Note  Patient: Alicia Benitez VFI:[id number]   PCP: Margit Hanks, MD DOB: 1936/04/08   DOA: 07/14/2019   DOS: 07/18/2019   Date of Service: the patient was seen and examined on 07/18/2019  Chief Complaint  Patient presents with  . Diarrhea   Brief hospital course: Alicia Benitez is a 83 y.o. femalewith medical history significant foradvanced dementia from memory care unit, hypothyroidism, hypertension, anxiety. Patient presented from memory care unit secondary to diarrhea and found to have fecal impaction  Currently further plan is continue manual disimpaction as well as enema.  Subjective: Per RN continues to have bowel movement.  No blood.  No nausea no vomiting.  No oral intake in last 24 hours.  Patient is lethargic but follows command.  Assessment and Plan: 1.  Diarrhea Fecal impaction Abdominal distention X-ray on 07/16/2019 shows severe rectal sigmoid stool burden with dilated bowels concerning for fecal impaction. Repeat x-ray on 07/18/2019 still shows evidence of rectal stool burden therefore we will use smog enema.  Some pinkish hue on the periphery of the stool FOBT positive. Smog enema with minimal improvement. Manual disimpaction with some benefit. Continue dys 3 diet. Resume oral bowel regimen.  Add simethicone.   2.  AKI Dehydration Hypertension Sinus tachycardia Continue with IV fluids reduce fluids.  Holding antihypertensive regimen. Monitor on telemetry  3.  E. coli UTI Treated with oral amoxicillin.  Monitor.  4.  Hypothyroidism. Continue Synthroid.  5.  NSVT. Monitor on telemetry. Check magnesium. Replaced aggressively If persistent or has frequent PVCs we will get echocardiogram.  6.  Anemia. Etiology not clear. H&H relatively stable since admission. FOBT positive but likely from rectal mucosal injury from hard stool. Monitor for now. Hold aspirin and DVT prophylaxis.  7.  Bilateral lower extremity edema.  We will get Doppler to rule out DVT although less likely. TED stockings. Likely third spacing in the setting of poor p.o. intake and chronic malnutrition.   Body mass index is 18.08 kg/m.  Nutrition Problem: Increased nutrient needs Etiology: wound healing Interventions: Interventions: Ensure Enlive (each supplement provides 350kcal and 20 grams of protein), Magic cup, MVI, Prostat   Left hip deep tissue injury. Please arrange admission. Wound care consulted. Continue frequent changes on foam dressing.  Pressure Injury 07/22/2019 Hip Left Deep Tissue Injury - Purple or maroon localized area of discolored intact skin or blood-filled blister due to damage of underlying soft tissue from pressure and/or shear. (Active)  07/24/2019 1342  Location: Hip  Location Orientation: Left  Staging: Deep Tissue Injury - Purple or maroon localized area of discolored intact skin or blood-filled blister due to damage of underlying soft tissue from pressure and/or shear.  Wound Description (Comments):   Present on Admission: Yes     Pressure Injury 07/08/2019 Hip Left Stage II -  Partial thickness loss of dermis presenting as a shallow open ulcer with a red, pink wound bed without slough. (Active)  07/01/2019 1344  Location: Hip  Location Orientation: Left  Staging: Stage II -  Partial thickness loss of dermis presenting as a shallow open ulcer with a red, pink wound bed without slough.  Wound Description (Comments):   Present on Admission: Yes     Pressure Injury 07/22/2019 Ischial tuberosity Left Unstageable - Full thickness tissue loss in which the base of the ulcer is covered by slough (yellow, tan, gray, green or brown) and/or eschar (tan, brown or black) in the wound bed. (Active)  07/15/2019 1346  Location: Ischial tuberosity  Location Orientation: Left  Staging: Unstageable - Full thickness tissue loss in which the base of the ulcer is covered by slough (yellow, tan, gray, green or brown) and/or eschar  (tan, brown or black) in the wound bed.  Wound Description (Comments):   Present on Admission: Yes    Diet: Cardiac diet  DVT Prophylaxis: SCD, pharmacological prophylaxis contraindicated due to Positive FOBT  Advance goals of care discussion: Full code  Family Communication: no family was present at bedside, at the time of interview.   Disposition:  Discharge to SNF when medically stable.  Consultants: none Procedures: noen  Scheduled Meds: . amoxicillin  500 mg Oral Q8H  . divalproex  125 mg Oral QHS  . enoxaparin (LOVENOX) injection  30 mg Subcutaneous Q24H  . famotidine  10 mg Oral Daily  . feeding supplement (ENSURE ENLIVE)  237 mL Oral BID BM  . feeding supplement (PRO-STAT SUGAR FREE 64)  30 mL Oral BID  . levothyroxine  88 mcg Oral QAC breakfast  . mouth rinse  15 mL Mouth Rinse BID  . mirtazapine  7.5 mg Oral QHS  . multivitamin with minerals  1 tablet Oral Daily  . PARoxetine  10 mg Oral Daily  . polyethylene glycol  17 g Oral BID  . simethicone  40 mg Oral QID  . sorbitol, milk of mag, mineral oil, glycerin (SMOG) enema  960 mL Rectal Once   Continuous Infusions: . lactated ringers 50 mL/hr at 07/18/19 1708   PRN Meds: acetaminophen **OR** acetaminophen, albuterol, HYDROcodone-acetaminophen, iohexol, ondansetron **OR** ondansetron (ZOFRAN) IV Antibiotics: Anti-infectives (From admission, onward)   Start     Dose/Rate Route Frequency Ordered Stop   07/14/19 0600  amoxicillin (AMOXIL) capsule 500 mg     500 mg Oral Every 8 hours 07/13/19 1158     07/12/19 1200  cefTRIAXone (ROCEPHIN) 1 g in sodium chloride 0.9 % 100 mL IVPB     1 g 200 mL/hr over 30 Minutes Intravenous Every 24 hours 07/12/19 1103 07/13/19 1418       Objective: Physical Exam: Vitals:   07/18/19 0433 07/18/19 1238 07/18/19 1621 07/18/19 2031  BP: 128/74 100/79 (!) 102/59 113/68  Pulse: 91 87 86 94  Resp: 16 15 17 16   Temp: 98.3 F (36.8 C) 98.6 F (37 C) 97.6 F (36.4 C) 98.8 F (37.1  C)  TempSrc: Oral Oral Oral Oral  SpO2: 97% 100% 95% 97%  Weight:      Height:        Intake/Output Summary (Last 24 hours) at 07/18/2019 2053 Last data filed at 07/18/2019 1510 Gross per 24 hour  Intake 1301.81 ml  Output 0 ml  Net 1301.81 ml   Filed Weights   07/15/2019 1311 07/14/19 0534  Weight: 49 kg 50.8 kg   General: alert and oriented to time, place, and person. Appear in moderate distress, affect appropriate Eyes: PERRL, Conjunctiva normal ENT: Oral Mucosa Clear, dry  Neck: no JVD, no Abnormal Mass Or lumps Cardiovascular: S1 and S2 Present, no Murmur, peripheral pulses symmetrical Respiratory: good respiratory effort, Bilateral Air entry equal and Decreased, no signs of accessory muscle use, Clear to Auscultation, no Crackles, no wheezes Abdomen: Bowel Sound present, Soft and distended, mild tenderness, no hernia Skin: no rashes  Extremities: no Pedal edema, no calf tenderness Neurologic: without any new focal findings Gait not checked due to patient safety concerns  Data Reviewed: I have personally reviewed and interpreted daily labs, tele strips, imagings as discussed above. I reviewed  all nursing notes, pharmacy notes, vitals, pertinent old records I have discussed plan of care as described above with RN and patient/family.  CBC: Recent Labs  Lab 07/13/19 1201 07/15/19 1157 07/16/19 1650 07/17/19 0354  WBC 6.7 8.3 11.4* 11.3*  NEUTROABS  --   --  9.2* 9.5*  HGB 9.9* 8.2* 8.2* 8.4*  HCT 31.6* 26.0* 26.5* 27.7*  MCV 94.0 94.2 94.3 96.5  PLT 232 249 308 321   Basic Metabolic Panel: Recent Labs  Lab 07/13/19 1201 07/15/19 1157 07/16/19 0820 07/16/19 1650 07/17/19 0354  NA 139 138 136 139 141  K 3.0* 3.5 3.5 3.7 3.7  CL 105 106 106 107 108  CO2 26 25 21* 24 23  GLUCOSE 118* 132* 126* 113* 126*  BUN 13 26* 33* 33* 39*  CREATININE 0.60 0.51 0.75 0.78 0.87  CALCIUM 7.9* 7.9* 8.2* 8.2* 8.6*  MG  --   --  2.2 2.0 2.3    Liver Function Tests:  Recent Labs  Lab 07/15/19 1157 07/16/19 0820 07/17/19 0354  AST 9* 12* 18  ALT 7 7 12   ALKPHOS 60 66 70  BILITOT 0.4 0.6 0.8  PROT 4.4* 4.9* 5.1*  ALBUMIN 1.7* 1.9* 2.0*   Recent Labs  Lab 07/16/19 0820  LIPASE 23   No results for input(s): AMMONIA in the last 168 hours. Coagulation Profile: No results for input(s): INR, PROTIME in the last 168 hours. Cardiac Enzymes: No results for input(s): CKTOTAL, CKMB, CKMBINDEX, TROPONINI in the last 168 hours. BNP (last 3 results) No results for input(s): PROBNP in the last 8760 hours. CBG: Recent Labs  Lab 07/17/19 0728 07/17/19 1553 07/18/19 0027 07/18/19 0755 07/18/19 1645  GLUCAP 97 81 80 87 130*   Studies: Dg Abd Portable 1v  Result Date: 07/18/2019 CLINICAL DATA:  Abdomen pain and distension EXAM: PORTABLE ABDOMEN - 1 VIEW COMPARISON:  07/17/2019, 07/16/2019, 07/15/2019 FINDINGS: Persistent large rectosigmoid stool burden. Continued gaseous dilatation of the colon, appears slightly decreased. IMPRESSION: Gaseous dilatation of colon appears slightly decreased, persistent large rectosigmoid stool burden. Electronically Signed   By: Jasmine PangKim  Fujinaga M.D.   On: 07/18/2019 16:26     Time spent: 35 minutes  Author: Lynden OxfordPranav Patel, MD [facility] 07/18/2019 8:53 PM  To reach On-call, see care teams to locate the attending and reach out to them via [email or web address]. If 7PM-7AM, please contact night-coverage If you still have difficulty reaching the attending provider, please page the [facility] (Director on Call) for [facility] on amion for assistance.

## 2019-07-18 NOTE — Care Management Important Message (Signed)
Important Message  Patient Details IM Letter given to Cookie McGibboney RN to present to the Patient Name: Alicia Benitez MRN: [id number] Date of Birth: April 15, 1936   Medicare Important Message Given:  Yes     Kerin Salen 07/18/2019, 11:08 AM

## 2019-07-19 ENCOUNTER — Inpatient Hospital Stay (HOSPITAL_COMMUNITY): Payer: Medicare Other

## 2019-07-19 DIAGNOSIS — R197 Diarrhea, unspecified: Secondary | ICD-10-CM | POA: Diagnosis not present

## 2019-07-19 DIAGNOSIS — R52 Pain, unspecified: Secondary | ICD-10-CM

## 2019-07-19 DIAGNOSIS — R609 Edema, unspecified: Secondary | ICD-10-CM | POA: Diagnosis not present

## 2019-07-19 LAB — GLUCOSE, CAPILLARY
Glucose-Capillary: 114 mg/dL — ABNORMAL HIGH (ref 70–99)
Glucose-Capillary: 150 mg/dL — ABNORMAL HIGH (ref 70–99)
Glucose-Capillary: 275 mg/dL — ABNORMAL HIGH (ref 70–99)
Glucose-Capillary: 284 mg/dL — ABNORMAL HIGH (ref 70–99)
Glucose-Capillary: 74 mg/dL (ref 70–99)

## 2019-07-19 LAB — BASIC METABOLIC PANEL
Anion gap: 8 (ref 5–15)
BUN: 29 mg/dL — ABNORMAL HIGH (ref 8–23)
CO2: 26 mmol/L (ref 22–32)
Calcium: 8.5 mg/dL — ABNORMAL LOW (ref 8.9–10.3)
Chloride: 110 mmol/L (ref 98–111)
Creatinine, Ser: 0.7 mg/dL (ref 0.44–1.00)
GFR calc Af Amer: >60 mL/min (ref >60)
GFR calc non Af Amer: >60 mL/min (ref >60)
Glucose, Bld: 103 mg/dL — ABNORMAL HIGH (ref 70–99)
Potassium: 3.3 mmol/L — ABNORMAL LOW (ref 3.5–5.1)
Sodium: 144 mmol/L (ref 135–145)

## 2019-07-19 LAB — CBC
HCT: 25.9 % — ABNORMAL LOW (ref 36.0–46.0)
Hemoglobin: 8.1 g/dL — ABNORMAL LOW (ref 12.0–15.0)
MCH: 30.1 pg (ref 26.0–34.0)
MCHC: 31.3 g/dL (ref 30.0–36.0)
MCV: 96.3 fL (ref 80.0–100.0)
Platelets: 385 10*3/uL (ref 150–400)
RBC: 2.69 MIL/uL — ABNORMAL LOW (ref 3.87–5.11)
RDW: 16.2 % — ABNORMAL HIGH (ref 11.5–15.5)
WBC: 8.7 10*3/uL (ref 4.0–10.5)
nRBC: 0 % (ref 0.0–0.2)

## 2019-07-19 LAB — MAGNESIUM: Magnesium: 2.3 mg/dL (ref 1.7–2.4)

## 2019-07-19 MED ORDER — POTASSIUM CHLORIDE 10 MEQ/100ML IV SOLN
10.0000 meq | INTRAVENOUS | Status: DC
  Filled 2019-07-19: qty 100

## 2019-07-19 MED ORDER — IOHEXOL 300 MG/ML  SOLN
100.0000 mL | Freq: Once | INTRAMUSCULAR | Status: AC | PRN
  Administered 2019-07-19: 100 mL via INTRAVENOUS

## 2019-07-19 MED ORDER — MORPHINE SULFATE (PF) 2 MG/ML IV SOLN
2.0000 mg | INTRAVENOUS | Status: DC | PRN
  Filled 2019-07-19: qty 1

## 2019-07-19 MED ORDER — FAMOTIDINE IN NACL 20-0.9 MG/50ML-% IV SOLN
20.0000 mg | INTRAVENOUS | Status: DC
  Administered 2019-07-19: 10:00:00 20 mg via INTRAVENOUS
  Filled 2019-07-19: qty 50

## 2019-07-19 MED ORDER — SODIUM CHLORIDE (PF) 0.9 % IJ SOLN
INTRAMUSCULAR | Status: AC
  Administered 2019-07-19: 20:00:00
  Filled 2019-07-19: qty 50

## 2019-07-19 MED ORDER — VALPROATE SODIUM 500 MG/5ML IV SOLN
250.0000 mg | Freq: Every day | INTRAVENOUS | Status: DC
  Administered 2019-07-19: 250 mg via INTRAVENOUS
  Filled 2019-07-19 (×2): qty 2.5

## 2019-07-19 MED ORDER — SORBITOL 70 % SOLN
960.0000 mL | TOPICAL_OIL | Freq: Once | ORAL | Status: AC
  Administered 2019-07-19: 960 mL via RECTAL
  Filled 2019-07-19: qty 473

## 2019-07-19 MED ORDER — KCL IN DEXTROSE-NACL 20-5-0.45 MEQ/L-%-% IV SOLN
INTRAVENOUS | Status: DC
  Administered 2019-07-19 (×2): via INTRAVENOUS
  Filled 2019-07-19: qty 1000

## 2019-07-19 MED ORDER — POTASSIUM CHLORIDE CRYS ER 20 MEQ PO TBCR
40.0000 meq | EXTENDED_RELEASE_TABLET | Freq: Once | ORAL | Status: DC
  Filled 2019-07-19: qty 2

## 2019-07-19 MED ORDER — POTASSIUM CHLORIDE 10 MEQ/100ML IV SOLN
10.0000 meq | INTRAVENOUS | Status: AC
  Administered 2019-07-19 (×6): 10 meq via INTRAVENOUS
  Filled 2019-07-19 (×5): qty 100

## 2019-07-19 MED ORDER — LEVOTHYROXINE SODIUM 100 MCG/5ML IV SOLN: 44.0000 ug | Freq: Every day | INTRAVENOUS | Status: DC

## 2019-07-19 NOTE — Progress Notes (Signed)
Lower extremity venous has been completed.   Preliminary results in CV Proc.   Alicia Benitez 07/19/2019 12:00 PM

## 2019-07-19 NOTE — Progress Notes (Signed)
SMOG enema given. Scant amount of  bright red blood noted out of rectum at the beginning of  enema instillation. Pt was able to pass 2 squirts ( of about 30 to 40 ml) of dark brown liquid stool during process. Also vomited about 50 ml of dark brown foul-smelling emesis during and after enema. Pt repositioned to side for comfort and to facilitate passage of stool. Will continue to monitor. VWilliams,RN.

## 2019-07-19 NOTE — Progress Notes (Signed)
NG tube inserted. Dark brown foul-smelling drainage noted followed by dark red drainage. Order placed for chest X-ray for confirmation. Tube clamped in mean time. Pt tolerated NG tube inserted.  VWilliams,RN.

## 2019-07-19 NOTE — Progress Notes (Signed)
[facility] Progress Note  Patient: Alicia Benitez WUJ:[id number]   PCP: Margit Hanks, MD DOB: 03/31/1936   DOA: 06/29/2019   DOS: 07/19/2019   Date of Service: the patient was seen and examined on 07/19/2019  Chief Complaint  Patient presents with   Diarrhea   Brief hospital course: Princesa Willig is a 83 y.o. femalewith medical history significant foradvanced dementia from memory care unit, hypothyroidism, hypertension, anxiety. Patient presented from memory care unit secondary to diarrhea and found to have fecal impaction  Currently further plan is continue manual disimpaction as well as enema.  Subjective: Patient reports nausea.  Abdomen is more distended.  No bowel movement so far.  After insertion of enema patient had an episode of vomiting as well.  Assessment and Plan: 1.  Diarrhea Fecal impaction Colonic ileus/pseudoobstruction Admission x-ray and CT abdomen showed large stool burden with fecal impaction. Aggressive bowel regimen, enema as well as surgery and manual impaction were performed. Currently no resolution of patient's fecal impaction and it appears that the patient is developing colonic ileus versus pseudoobstruction. Repeat CT abdomen performed today on 09/18/2019 shows distal esophagus distention as well as gastric fundus distention without any small bowel dilatation and distention of the colon from cecum to rectum. Possible colonic pseudoobstruction recommended radiology but no evidence of bowel obstruction gastroenterology consulted. We will keep the patient n.p.o. Insert NG tube. Continue with IV fluids. Monitor response. Maintain K more than 4 mag more than 2.  2.  AKI Dehydration Hypertension Sinus tachycardia Continue with IV fluids reduce fluids.  Holding antihypertensive regimen. Monitor on telemetry  3.  E. coli UTI Treated with oral amoxicillin.  Monitor. Completed treatment course.  4.  Hypothyroidism. Continue  Synthroid.  5.  NSVT. Monitor on telemetry. If persistent or has frequent PVCs we will get echocardiogram.  6.  Anemia. Etiology not clear. H&H relatively stable since admission. FOBT positive but likely from rectal mucosal injury from hard stool. Monitor for now.  7.  Bilateral lower extremity edema. Duplex negative for DVT. TED stockings. Likely third spacing in the setting of poor p.o. intake and chronic malnutrition.  Body mass index is 18.08 kg/m.  Nutrition Problem: Increased nutrient needs Etiology: wound healing Interventions: Interventions: Ensure Enlive (each supplement provides 350kcal and 20 grams of protein), Magic cup, MVI, Prostat   Left hip deep tissue injury. Present on admission Wound care consulted. Continue frequent changes on foam dressing.  Pressure Injury 07/17/2019 Hip Left Deep Tissue Injury - Purple or maroon localized area of discolored intact skin or blood-filled blister due to damage of underlying soft tissue from pressure and/or shear. (Active)  06/27/2019 1342  Location: Hip  Location Orientation: Left  Staging: Deep Tissue Injury - Purple or maroon localized area of discolored intact skin or blood-filled blister due to damage of underlying soft tissue from pressure and/or shear.  Wound Description (Comments):   Present on Admission: Yes     Pressure Injury 07/15/2019 Hip Left Stage II -  Partial thickness loss of dermis presenting as a shallow open ulcer with a red, pink wound bed without slough. (Active)  07/16/2019 1344  Location: Hip  Location Orientation: Left  Staging: Stage II -  Partial thickness loss of dermis presenting as a shallow open ulcer with a red, pink wound bed without slough.  Wound Description (Comments):   Present on Admission: Yes     Pressure Injury 07/25/2019 Ischial tuberosity Left Unstageable - Full thickness tissue loss in which the base of the ulcer  is covered by slough (yellow, tan, gray, green or brown) and/or eschar  (tan, brown or black) in the wound bed. (Active)  06/28/2019 1346  Location: Ischial tuberosity  Location Orientation: Left  Staging: Unstageable - Full thickness tissue loss in which the base of the ulcer is covered by slough (yellow, tan, gray, green or brown) and/or eschar (tan, brown or black) in the wound bed.  Wound Description (Comments):   Present on Admission: Yes    Diet: NPO diet  DVT Prophylaxis: Lovenox  Advance goals of care discussion: Full code  Family Communication: no family was present at bedside, at the time of interview.  Unable to reach the family on the phone.  Disposition:  Discharge to SNF when medically stable.  Consultants: Gastroenterology, [facility] Procedures: NG tube insertion on 07/19/2019  Scheduled Meds:  enoxaparin (LOVENOX) injection  30 mg Subcutaneous Q24H   [START ON Mar 08, 2019] levothyroxine  44 mcg Intravenous Daily   mouth rinse  15 mL Mouth Rinse BID   polyethylene glycol  17 g Oral BID   simethicone  40 mg Oral QID   sodium chloride (PF)       Continuous Infusions:  dextrose 5 % and 0.45 % NaCl with KCl 20 mEq/L 75 mL/hr at 07/19/19 1020   famotidine (PEPCID) IV 20 mg (07/19/19 1022)   valproate sodium 250 mg (07/19/19 1251)   PRN Meds: acetaminophen **OR** acetaminophen, albuterol, iohexol, morphine injection, ondansetron **OR** ondansetron (ZOFRAN) IV Antibiotics: Anti-infectives (From admission, onward)   Start     Dose/Rate Route Frequency Ordered Stop   07/14/19 0600  amoxicillin (AMOXIL) capsule 500 mg  Status:  Discontinued     500 mg Oral Every 8 hours 07/13/19 1158 07/19/19 0748   07/12/19 1200  cefTRIAXone (ROCEPHIN) 1 g in sodium chloride 0.9 % 100 mL IVPB     1 g 200 mL/hr over 30 Minutes Intravenous Every 24 hours 07/12/19 1103 07/13/19 1418       Objective: Physical Exam: Vitals:   07/18/19 2031 07/19/19 0448 07/19/19 1306 07/19/19 1540  BP: 113/68 (!) 142/80 (!) 162/98 (!) 167/103  Pulse: 94 (!) 110 (!)  110 (!) 104  Resp: 16 16 17    Temp: 98.8 F (37.1 C) 99.1 F (37.3 C) 98.4 F (36.9 C) 98.2 F (36.8 C)  TempSrc: Oral Oral Oral Oral  SpO2: 97% 90% 97% 97%  Weight:      Height:        Intake/Output Summary (Last 24 hours) at 07/19/2019 1734 Last data filed at 07/19/2019 0600 Gross per 24 hour  Intake 750 ml  Output --  Net 750 ml   Filed Weights   07/04/2019 1311 07/14/19 0534  Weight: 49 kg 50.8 kg   General: alert and oriented to time, place, and person. Appear in mild distress, affect appropriate Eyes: PERRL, Conjunctiva normal ENT: Oral Mucosa Clear, moist  Neck: no JVD, no Abnormal Mass Or lumps Cardiovascular: S1 and S2 Present, no Murmur, peripheral pulses symmetrical Respiratory: good respiratory effort, Bilateral Air entry equal and Decreased, no signs of accessory muscle use, Clear to Auscultation, no Crackles, no wheezes Abdomen: Bowel Sound present, Soft and no tenderness, distended, no hernia Skin: no rashes  Extremities: bilateral Pedal edema, no calf tenderness Neurologic: without any new focal findings Gait not checked due to patient safety concerns   Data Reviewed: I have personally reviewed and interpreted daily labs, tele strips, imagings as discussed above. I reviewed all nursing notes, pharmacy notes, vitals, pertinent old records  I have discussed plan of care as described above with RN and patient/family.  CBC: Recent Labs  Lab 07/13/19 1201 07/15/19 1157 07/16/19 1650 07/17/19 0354 07/19/19 0436  WBC 6.7 8.3 11.4* 11.3* 8.7  NEUTROABS  --   --  9.2* 9.5*  --   HGB 9.9* 8.2* 8.2* 8.4* 8.1*  HCT 31.6* 26.0* 26.5* 27.7* 25.9*  MCV 94.0 94.2 94.3 96.5 96.3  PLT 232 249 308 321 502   Basic Metabolic Panel: Recent Labs  Lab 07/15/19 1157 07/16/19 0820 07/16/19 1650 07/17/19 0354 07/19/19 0436  NA 138 136 139 141 144  K 3.5 3.5 3.7 3.7 3.3*  CL 106 106 107 108 110  CO2 25 21* 24 23 26   GLUCOSE 132* 126* 113* 126* 103*  BUN 26* 33*  33* 39* 29*  CREATININE 0.51 0.75 0.78 0.87 0.70  CALCIUM 7.9* 8.2* 8.2* 8.6* 8.5*  MG  --  2.2 2.0 2.3 2.3    Liver Function Tests: Recent Labs  Lab 07/15/19 1157 07/16/19 0820 07/17/19 0354  AST 9* 12* 18  ALT 7 7 12   ALKPHOS 60 66 70  BILITOT 0.4 0.6 0.8  PROT 4.4* 4.9* 5.1*  ALBUMIN 1.7* 1.9* 2.0*   Recent Labs  Lab 07/16/19 0820  LIPASE 23   No results for input(s): AMMONIA in the last 168 hours. Coagulation Profile: No results for input(s): INR, PROTIME in the last 168 hours. Cardiac Enzymes: No results for input(s): CKTOTAL, CKMB, CKMBINDEX, TROPONINI in the last 168 hours. BNP (last 3 results) No results for input(s): PROBNP in the last 8760 hours. CBG: Recent Labs  Lab 07/18/19 0755 07/18/19 1645 07/19/19 0011 07/19/19 0747 07/19/19 1650  GLUCAP 87 130* 74 114* 150*   Studies: Ct Abdomen Pelvis W Contrast  Result Date: 07/19/2019 CLINICAL DATA:  Evaluate abdominal distension. EXAM: CT ABDOMEN AND PELVIS WITH CONTRAST TECHNIQUE: Multidetector CT imaging of the abdomen and pelvis was performed using the standard protocol following bolus administration of intravenous contrast. CONTRAST:  138mL OMNIPAQUE IOHEXOL 300 MG/ML  SOLN COMPARISON:  06/30/2019 FINDINGS: Lower chest: Small bilateral pleural effusions identified left greater than right. These are new from previous exam. Hepatobiliary: No focal liver abnormality identified. Mild periportal edema identified. The gallbladder is unremarkable. No gallbladder wall inflammation. No significant bile duct dilatation. Pancreas: Unremarkable. No pancreatic ductal dilatation or surrounding inflammatory changes. Spleen: Normal in size without focal abnormality. Adrenals/Urinary Tract: Normal adrenal glands. Asymmetric left renal atrophy. Bilateral kidney lesions of varying size are identified most of these are too small to reliably characterize but likely represent simple cysts. No enhancing mass noted at this time.  Stomach/Bowel: The distal esophagus is distended with air-fluid level. There is also distension of the gastric fundus. No significant small bowel dilatation, inflammation or wall thickening. Marked distension of the colon from the cecum to the level of the rectum. Liquefied stool with air-fluid levels are identified throughout the dilated bowel loops. The previous large volume desiccated stool ball involving the sigmoid colon and rectum now appears fragmented and decreased in size. Vascular/Lymphatic: Aortic atherosclerosis. No aneurysm. No abdominopelvic adenopathy. Reproductive: Status post hysterectomy. No adnexal masses. Other: No free fluid or fluid collections. No pneumoperitoneum identified. Musculoskeletal: Previous hardware fixation of the proximal right femur. Unchanged appearance of fracture deformity involving L1 vertebra appear IMPRESSION: 1. Marked distension of the colon from the cecum to the level of the rectum. Liquefied stool with air-fluid levels identified throughout the dilated bowel loops. Findings may reflect colonic pseudoobstruction secondary to oral  cathartics. 2. The previous large volume desiccated stool ball involving the sigmoid colon and rectum now appears fragmented and decreased in size from previous exam. 3. New small bilateral pleural effusions left greater than right. Aortic Atherosclerosis (ICD10-I70.0). Electronically Signed   By: Signa Kell M.D.   On: 07/19/2019 11:25   Vas [place] Lower Extremity Venous (dvt)  Result Date: 07/19/2019  Lower Venous Study Indications: Edema, and Pain.  Comparison Study: no prior Performing Technologist: Blanch Media RVS  Examination Guidelines: A complete evaluation includes B-mode imaging, spectral Doppler, color Doppler, and power Doppler as needed of all accessible portions of each vessel. Bilateral testing is considered an integral part of a complete examination. Limited examinations for reoccurring indications may be performed as noted.   +---------+---------------+---------+-----------+----------+--------------+  RIGHT     Compressibility Phasicity Spontaneity Properties Thrombus Aging  +---------+---------------+---------+-----------+----------+--------------+  CFV       Full            Yes       Yes                                    +---------+---------------+---------+-----------+----------+--------------+  SFJ       Full                                                             +---------+---------------+---------+-----------+----------+--------------+  FV Prox   Full                                                             +---------+---------------+---------+-----------+----------+--------------+  FV Mid    Full                                                             +---------+---------------+---------+-----------+----------+--------------+  FV Distal Full                                                             +---------+---------------+---------+-----------+----------+--------------+  PFV       Full                                                             +---------+---------------+---------+-----------+----------+--------------+  POP       Full            Yes       Yes                                    +---------+---------------+---------+-----------+----------+--------------+  PTV       Full                                                             +---------+---------------+---------+-----------+----------+--------------+  PERO      Full                                                             +---------+---------------+---------+-----------+----------+--------------+   +---------+---------------+---------+-----------+----------+--------------+  LEFT      Compressibility Phasicity Spontaneity Properties Thrombus Aging  +---------+---------------+---------+-----------+----------+--------------+  CFV       Full            Yes       Yes                                     +---------+---------------+---------+-----------+----------+--------------+  SFJ       Full                                                             +---------+---------------+---------+-----------+----------+--------------+  FV Prox   Full                                                             +---------+---------------+---------+-----------+----------+--------------+  FV Mid    Full                                                             +---------+---------------+---------+-----------+----------+--------------+  FV Distal Full                                                             +---------+---------------+---------+-----------+----------+--------------+  PFV       Full                                                             +---------+---------------+---------+-----------+----------+--------------+  POP       Full            Yes       Yes                                    +---------+---------------+---------+-----------+----------+--------------+  PTV       Full                                                             +---------+---------------+---------+-----------+----------+--------------+  PERO                                                       Not visualized  +---------+---------------+---------+-----------+----------+--------------+     Summary: Right: There is no evidence of deep vein thrombosis in the lower extremity. No cystic structure found in the popliteal fossa. Left: There is no evidence of deep vein thrombosis in the lower extremity. No cystic structure found in the popliteal fossa.  *See table(s) above for measurements and observations.    Preliminary      Time spent: 35 minutes  Author: Lynden Oxford, MD [facility] 07/19/2019 5:34 PM  To reach On-call, see care teams to locate the attending and reach out to them via [email or web address]. If 7PM-7AM, please contact night-coverage If you still have difficulty reaching the attending provider, please page the  [facility] (Director on Call) for [facility] on amion for assistance.

## 2019-07-19 NOTE — Progress Notes (Signed)
NGT repositioned per radiology recommendation. Awaiting repeat Xray.

## 2019-07-20 ENCOUNTER — Inpatient Hospital Stay (HOSPITAL_COMMUNITY): Payer: Medicare Other | Admitting: Certified Registered Nurse Anesthetist

## 2019-07-20 ENCOUNTER — Inpatient Hospital Stay (HOSPITAL_COMMUNITY): Payer: Medicare Other

## 2019-07-20 DIAGNOSIS — N179 Acute kidney failure, unspecified: Secondary | ICD-10-CM | POA: Diagnosis not present

## 2019-07-20 DIAGNOSIS — E875 Hyperkalemia: Secondary | ICD-10-CM

## 2019-07-20 DIAGNOSIS — Z01818 Encounter for other preprocedural examination: Secondary | ICD-10-CM

## 2019-07-20 DIAGNOSIS — R112 Nausea with vomiting, unspecified: Secondary | ICD-10-CM

## 2019-07-20 DIAGNOSIS — K5900 Constipation, unspecified: Secondary | ICD-10-CM | POA: Diagnosis not present

## 2019-07-20 DIAGNOSIS — K567 Ileus, unspecified: Principal | ICD-10-CM

## 2019-07-20 DIAGNOSIS — R197 Diarrhea, unspecified: Secondary | ICD-10-CM | POA: Diagnosis not present

## 2019-07-20 LAB — COMPREHENSIVE METABOLIC PANEL
ALT: <5 U/L (ref 0–44)
AST: 145 U/L — ABNORMAL HIGH (ref 15–41)
Albumin: 1.5 g/dL — ABNORMAL LOW (ref 3.5–5.0)
Alkaline Phosphatase: 65 U/L (ref 38–126)
Anion gap: 16 — ABNORMAL HIGH (ref 5–15)
BUN: 24 mg/dL — ABNORMAL HIGH (ref 8–23)
CO2: 23 mmol/L (ref 22–32)
Calcium: 12 mg/dL — ABNORMAL HIGH (ref 8.9–10.3)
Chloride: 109 mmol/L (ref 98–111)
Creatinine, Ser: 1.05 mg/dL — ABNORMAL HIGH (ref 0.44–1.00)
GFR calc Af Amer: 58 mL/min — ABNORMAL LOW (ref >60)
GFR calc non Af Amer: 50 mL/min — ABNORMAL LOW (ref >60)
Glucose, Bld: 265 mg/dL — ABNORMAL HIGH (ref 70–99)
Potassium: 4.8 mmol/L (ref 3.5–5.1)
Sodium: 148 mmol/L — ABNORMAL HIGH (ref 135–145)
Total Bilirubin: 0.3 mg/dL (ref 0.3–1.2)
Total Protein: 4.2 g/dL — ABNORMAL LOW (ref 6.5–8.1)

## 2019-07-20 LAB — CBC
HCT: 25.5 % — ABNORMAL LOW (ref 36.0–46.0)
Hemoglobin: 7.4 g/dL — ABNORMAL LOW (ref 12.0–15.0)
MCH: 29.8 pg (ref 26.0–34.0)
MCHC: 29 g/dL — ABNORMAL LOW (ref 30.0–36.0)
MCV: 102.8 fL — ABNORMAL HIGH (ref 80.0–100.0)
Platelets: 222 10*3/uL (ref 150–400)
RBC: 2.48 MIL/uL — ABNORMAL LOW (ref 3.87–5.11)
RDW: 16.5 % — ABNORMAL HIGH (ref 11.5–15.5)
WBC: 9.2 10*3/uL (ref 4.0–10.5)
nRBC: 0.9 % — ABNORMAL HIGH (ref 0.0–0.2)

## 2019-07-20 LAB — LACTIC ACID, PLASMA: Lactic Acid, Venous: >11 mmol/L (ref 0.5–1.9)

## 2019-07-20 LAB — MAGNESIUM: Magnesium: 3 mg/dL — ABNORMAL HIGH (ref 1.7–2.4)

## 2019-07-20 MED ORDER — HALOPERIDOL LACTATE 2 MG/ML PO CONC
0.5000 mg | ORAL | Status: DC | PRN
  Filled 2019-07-20: qty 0.3

## 2019-07-20 MED ORDER — SODIUM CHLORIDE 0.9 % IV SOLN: INTRAVENOUS | Status: DC

## 2019-07-20 MED ORDER — ONDANSETRON HCL 4 MG/2ML IJ SOLN: 4.0000 mg | Freq: Four times a day (QID) | INTRAMUSCULAR | Status: DC | PRN

## 2019-07-20 MED ORDER — GLYCOPYRROLATE 0.2 MG/ML IJ SOLN: 0.2000 mg | INTRAMUSCULAR | Status: DC | PRN

## 2019-07-20 MED ORDER — SODIUM BICARBONATE-DEXTROSE 150-5 MEQ/L-% IV SOLN
150.0000 meq | INTRAVENOUS | Status: DC
  Administered 2019-07-20: 150 meq via INTRAVENOUS
  Filled 2019-07-20 (×2): qty 1000

## 2019-07-20 MED ORDER — EPINEPHRINE HCL 5 MG/250ML IV SOLN IN NS
0.5000 ug/min | INTRAVENOUS | Status: DC
  Filled 2019-07-20: qty 250

## 2019-07-20 MED ORDER — GLYCOPYRROLATE 1 MG PO TABS: 1.0000 mg | ORAL_TABLET | ORAL | Status: DC | PRN

## 2019-07-20 MED ORDER — POLYVINYL ALCOHOL 1.4 % OP SOLN
1.0000 [drp] | Freq: Four times a day (QID) | OPHTHALMIC | Status: DC | PRN
  Filled 2019-07-20: qty 15

## 2019-07-20 MED ORDER — LORAZEPAM 2 MG/ML IJ SOLN: 1.0000 mg | INTRAMUSCULAR | Status: DC | PRN

## 2019-07-20 MED ORDER — ONDANSETRON 4 MG PO TBDP: 4.0000 mg | ORAL_TABLET | Freq: Four times a day (QID) | ORAL | Status: DC | PRN

## 2019-07-20 MED ORDER — CHLORHEXIDINE GLUCONATE CLOTH 2 % EX PADS: 6.0000 | MEDICATED_PAD | Freq: Every day | CUTANEOUS | Status: DC

## 2019-07-20 MED ORDER — BIOTENE DRY MOUTH MT LIQD: 15.0000 mL | OROMUCOSAL | Status: DC | PRN

## 2019-07-20 MED ORDER — LORAZEPAM 1 MG PO TABS: 1.0000 mg | ORAL_TABLET | ORAL | Status: DC | PRN

## 2019-07-20 MED ORDER — HALOPERIDOL LACTATE 5 MG/ML IJ SOLN: 0.5000 mg | INTRAMUSCULAR | Status: DC | PRN

## 2019-07-20 MED ORDER — MORPHINE SULFATE (PF) 2 MG/ML IV SOLN: 1.0000 mg | INTRAVENOUS | Status: DC | PRN

## 2019-07-20 MED ORDER — HALOPERIDOL 0.5 MG PO TABS
0.5000 mg | ORAL_TABLET | ORAL | Status: DC | PRN
  Filled 2019-07-20: qty 1

## 2019-07-20 MED ORDER — LORAZEPAM 2 MG/ML PO CONC: 1.0000 mg | ORAL | Status: DC | PRN

## 2019-07-20 MED FILL — Medication: Qty: 1 | Status: AC

## 2019-07-27 NOTE — Anesthesia Procedure Notes (Signed)
Procedure Name: Intubation Date/Time: 08-11-19 12:08 AM Performed by: Montel Clock, CRNA Pre-anesthesia Checklist: Patient identified, Emergency Drugs available, Suction available, Patient being monitored and Timeout performed Patient Re-evaluated:Patient Re-evaluated prior to induction Oxygen Delivery Method: Ambu bag Preoxygenation: Pre-oxygenation with 100% oxygen Ventilation: Mask ventilation without difficulty Laryngoscope Size: Mac and 3 Grade View: Grade II Tube type: Oral Tube size: 7.5 mm Number of attempts: 1 Airway Equipment and Method: Stylet Placement Confirmation: ETT inserted through vocal cords under direct vision,  breath sounds checked- equal and bilateral and CO2 detector Secured at: 21 cm Tube secured with: Tape Dental Injury: Teeth and Oropharynx as per pre-operative assessment

## 2019-07-27 NOTE — Progress Notes (Signed)
During CPR end tidal was 26.

## 2019-07-27 NOTE — Progress Notes (Signed)
Called to a code ER MD at bedside Pt intubated on bicarb drip Vitals stable  KUB with no sig change from prior showing sever ileus discussed with family They informed me pt is DNR/DNI But since she is intubated already Requested keeping her intubated until family arrives from [place] family patient may pass prior to their arrival They advised no escalation of  Care Concentrate on comfort measures only until they arrive Discussed with PCCM they are aware of the patient    Jevonte Clanton 12:52 AM

## 2019-07-27 NOTE — Progress Notes (Addendum)
RN arrived to room to advance NGT approx 5cm as instructed for optimal placement for LWS use. Pt tolerated advancement well. RN stepped out of the room to gather supplies to clean pt due to BM. RN returned and noticed patient legs were cool and mottled. Pt appeared notably more lethargic and tachypnic with resp 30. Pt arousing to verbal stimuli and speaking in weak voice when asked if she felt sleepy. Mitts had been in place since original placement of NGT prior to shift change to keep pt from removing tube. Pt A/O x 1 at baseline and had been fidgeting with mitts since shift change, which she was no longer doing at this time. Pt weakly grasped RNs hand upon request. VS obtained. BP 103/35, HR 49, FSBS 275, O2 78% on RA with cold fingers. Attempted for second SpO2 reading. Previously charted VS 141/98, HR 119 at 2007 and had been satting fine on RA. Radiology arrived at this time to verify placement of NGT. VS recycled as radiology preparing for xray, BP 49/13, HR 39. Sats not improved. Placed on NRB. Charge RN notified and Rapid [facility] called. Maudie Mercury, MD and Harlin Heys, MD aware.

## 2019-07-27 NOTE — Consult Note (Addendum)
..   NAME:  Alicia Benitez, MRN:  [id number], DOB:  12-12-1935, LOS: 10 ADMISSION DATE:  07/01/2019, CONSULTATION DATE:  Aug 11, 2019 REFERRING MD:  Lana Fish  MD, CHIEF COMPLAINT:  S/p cardiac arrest.    Brief History   83 yr old pt w/ PMHx sig for  Advanced Dementia, hypothyroidism, HTN, anxiety admitted on 10/14 for Dehydration secondary to diarrhea from overflow from significant fecal impaction. S/p cardiac arrest> intubated by Anesthesia> aspirate in ETT. Long downtime per EDP.  TRH spoke to [facility] and Code Status was confirmed as DNR/DNI. The GOC are comfort. No escalation of care. PCCM consulted. Pt expired at 203 AM  History of present illness   ( History obtained from EMR and acct of other providers)  83 yr old F w/ PMHx Advanced dementia, HTN, Hypothyroidism, osteoporosis, GERD and depression presented on 10/14 with diarrhea, abdominal distension, and fevers. Found to have significant fecal impaction without bowel obstruction, Acute kidney injury and hyperkalemia. She was admitted by [facility] on 10/25 Post NGT placement pt had a cardiac arrest During code she was endotracheally intubated and post ROSC started on Epi gtt and Bicarb gtt. Per documentation by [facility] pt's family stated her code status is DNR/DNI. Goals of care have been changed to comfort. Family is coming from [facility] and would like pt to continue on current care until they arrive. Pt moved to ICU. PCCM consulted.  TRH spoke to [facility] and Code Status was confirmed as DNR/DNI. The GOC are comfort. No escalation of care. PCCM consulted. Pt expired at 203 AM   Past Medical History  .Marland Kitchen Active Ambulatory Problems    Diagnosis Date Noted  . Closed fracture dislocation of lumbar spine (HCC) 11/08/2018  . Compression fracture of L1 lumbar vertebra (HCC) 12/21/2018  . Closed fracture of transverse process of thoracic vertebra (HCC) 12/21/2018  . Thoracic spine fracture (HCC) 10/21/2018  . Hypertension 12/21/2018   . Hypothyroidism 12/21/2018  . Depression, major, single episode, in partial remission (HCC) 12/21/2018  . GERD (gastroesophageal reflux disease) 12/21/2018  . Dementia with behavioral disturbance (HCC) 12/21/2018  . Multiple rib fractures 12/21/2018  . Lung contusion 12/21/2018  . Mood disorder (HCC) 03/08/2019   Resolved Ambulatory Problems    Diagnosis Date Noted  . No Resolved Ambulatory Problems   Past Medical History:  Diagnosis Date  . Anxiety   . Dementia (HCC)   . Hypercholesteremia   . Osteoporosis   . Thyroid disease      Significant Hospital Events   S/p cardiac arrest Endotracheally intubated  Consults:  10/25>>PCCM   Procedures:  Endotracheal intubation  Significant Diagnostic Tests:  07/19/2019>>CTAB PELVIS IMPRESSION: 1. Marked distension of the colon from the cecum to the level of the rectum. Liquefied stool with air-fluid levels identified throughout the dilated bowel loops. Findings may reflect colonic pseudoobstruction secondary to oral cathartics. 2. The previous large volume desiccated stool ball involving the sigmoid colon and rectum now appears fragmented and decreased in size from previous exam. 3. New small bilateral pleural effusions left greater than right.  10/24 at 2354  DG ABD FINDINGS: Slight interval advancement of the NG tube with side-port at or just distal to the GE junction and tip in the proximal stomach. Further advancement of the tube by an additional 4-5 cm, if possible, recommended for optimal positioning. Dilated colon similar to prior radiographs.  LABS: LA>11 Na 140 K= 4.8 Cl= 109 Bicarb 23 Glucose 265 BUN 24 Cr 1.05 GFR 50 Calcium 12 Albumin 1.5  Mag 3 AST 145 ALT <5 Bili 0.3  WBC 9.2 Hgb 7.4 MCV 102 ( previously 96) Plts 222 Micro Data:  07/14/2019>>>SARSCOV2 negative 9 also negative on 10/14) 16-Jul-2019>>>Blood cx x 2 NGTD 07/11/2019>>>Urine culture positive Ecoli pan sensitive 16-Jul-2019>>>MRSA PCR negative   Antimicrobials:  Amoxicillin 500mg  capsule Q 8 hrs 10/19-10/24 Rocephin 1g Q 24 started on 10/17- 10/18  Objective   Blood pressure (!) 103/35, pulse (!) 116, temperature 97.7 F (36.5 C), temperature source Oral, resp. rate 18, height 5\' 6"  (1.676 m), weight 50.8 kg, SpO2 93 %.    Vent Mode: PRVC FiO2 (%):  [100 %] 100 % Set Rate:  [18 bmp] 18 bmp Vt Set:  [420 mL-470 mL] 470 mL PEEP:  [5 cmH20] 5 cmH20   Intake/Output Summary (Last 24 hours) at 07/16/2019 0115 Last data filed at 07/19/2019 0600 Gross per 24 hour  Intake 350 ml  Output -  Net 350 ml   Filed Weights   07-16-2019 1311 07/14/19 0534  Weight: 49 kg 50.8 kg    Assessment & Plan:  1. S/p cardiac arrest 2. Acute hypoxic Respiratory Failure Aspiration (aspirate in ETT) 3. Fecal Impaction  4. Anion Gap Metabolic Acidosis 5. Hyperglycemic 6. Anemia   Code status changed to DNR/DNI after Primary team had a discussion with Family Family wishes for the patient to be comfort care but to continue on current support until they arrive.No escalation of Care. Pt was withdrawn from care when family arrived and expired at 203AM  Labs   CBC: Recent Labs  Lab 07/15/19 1157 07/16/19 1650 07/17/19 0354 07/19/19 0436 07/19/2019 0018  WBC 8.3 11.4* 11.3* 8.7 9.2  NEUTROABS  --  9.2* 9.5*  --   --   HGB 8.2* 8.2* 8.4* 8.1* 7.4*  HCT 26.0* 26.5* 27.7* 25.9* 25.5*  MCV 94.2 94.3 96.5 96.3 102.8*  PLT 249 308 321 385 222    Basic Metabolic Panel: Recent Labs  Lab 07/16/19 0820 07/16/19 1650 07/17/19 0354 07/19/19 0436 07/25/2019 0018  NA 136 139 141 144 148*  K 3.5 3.7 3.7 3.3* 4.8  CL 106 107 108 110 109  CO2 21* 24 23 26 23   GLUCOSE 126* 113* 126* 103* 265*  BUN 33* 33* 39* 29* 24*  CREATININE 0.75 0.78 0.87 0.70 1.05*  CALCIUM 8.2* 8.2* 8.6* 8.5* 12.0*  MG 2.2 2.0 2.3 2.3 3.0*   GFR: Estimated Creatinine Clearance: 33.7 mL/min (A) (by C-G formula based on SCr of 1.05 mg/dL (H)). Recent Labs  Lab  07/16/19 0820 07/16/19 1650 07/17/19 0354 07/19/19 0436 07/07/2019 0018  WBC  --  11.4* 11.3* 8.7 9.2  LATICACIDVEN 1.6  --  1.9  --   --     Liver Function Tests: Recent Labs  Lab 07/15/19 1157 07/16/19 0820 07/17/19 0354 07/24/2019 0018  AST 9* 12* 18 145*  ALT 7 7 12  PENDING  ALKPHOS 60 66 70 65  BILITOT 0.4 0.6 0.8 0.3  PROT 4.4* 4.9* 5.1* 4.2*  ALBUMIN 1.7* 1.9* 2.0* 1.5*   Recent Labs  Lab 07/16/19 0820  LIPASE 23   No results for input(s): AMMONIA in the last 168 hours.  ABG No results found for: PHART, PCO2ART, PO2ART, HCO3, TCO2, ACIDBASEDEF, O2SAT   Coagulation Profile: No results for input(s): INR, PROTIME in the last 168 hours.  Cardiac Enzymes: No results for input(s): CKTOTAL, CKMB, CKMBINDEX, TROPONINI in the last 168 hours.  HbA1C: No results found for: HGBA1C  CBG: Recent Labs  Lab 07/19/19 0011  07/19/19 0747 07/19/19 1650 07/19/19 2340 07/19/19 2345  GLUCAP 74 114* 150* 284* 275*    Review of Systems:   Marland KitchenMarland KitchenReview of Systems  Unable to perform ROS: Intubated     Past Medical History  She,  has a past medical history of Anxiety, Dementia ([place]), Hypercholesteremia, Hypertension, Osteoporosis, and Thyroid disease.   Surgical History    Past Surgical History:  Procedure Laterality Date  . ABDOMINAL HYSTERECTOMY    . APPENDECTOMY    . BREAST BIOPSY Left   . excision of meckel diverticulum    . IM Nailing Hip fracture    . salpingophorectomy       Social History   reports that she has never smoked. She has never used smokeless tobacco.   Family History   Her family history includes Cancer in her son; Diabetes in her brother; Hyperlipidemia in her mother.   Allergies Allergies  Allergen Reactions  . Phenytoin     On MAR  . [place] Venom [Bee Venom]      Home Medications  Prior to Admission medications   Medication Sig Start Date End Date Taking? Authorizing Provider  acetaminophen (TYLENOL) 325 MG tablet Take 650  mg by mouth every 8 (eight) hours as needed for mild pain or moderate pain.    Yes [provider]  Amino Acids-Protein Hydrolys (FEEDING SUPPLEMENT, PRO-STAT SUGAR FREE 64,) LIQD Take 30 mLs by mouth 2 (two) times daily.   Yes [provider]  aspirin 81 MG chewable tablet Chew 81 mg by mouth daily.    Yes [provider]  Cholecalciferol (VITAMIN D3) 1.25 MG (50000 UT) CAPS Take 1 capsule by mouth once a week.   Yes [provider]  divalproex (DEPAKOTE) 125 MG DR tablet Take 125 mg by mouth at bedtime.    Yes [provider]  famotidine (PEPCID) 20 MG tablet Take 20 mg by mouth 2 (two) times daily.    Yes [provider]  levothyroxine (SYNTHROID, LEVOTHROID) 88 MCG tablet Take 88 mcg by mouth daily before breakfast.    Yes [provider]  lisinopril (PRINIVIL,ZESTRIL) 20 MG tablet Take 20 mg by mouth daily.    Yes [provider]  loperamide (IMODIUM) 2 MG capsule Take 2 mg by mouth as needed for diarrhea or loose stools.   Yes [provider]  metoprolol tartrate (LOPRESSOR) 25 MG tablet Take 25 mg by mouth 2 (two) times daily.    Yes [provider]  mirtazapine (REMERON) 7.5 MG tablet Take 7.5 mg by mouth at bedtime.   Yes [provider]  Nutritional Supplements (NUTRITIONAL SUPPLEMENT PO) Take 1 each by mouth 2 (two) times a day. Magic Cup lunch and dinner   Yes [provider]  PARoxetine (PAXIL) 10 MG tablet Take 10 mg by mouth daily.   Yes [provider]  potassium chloride (MICRO-K) 10 MEQ CR capsule Take 10 mEq by mouth 2 (two) times daily.    Yes [provider]      I, Dr Seward Carol have personally reviewed patient's available data, including medical history, events of note, physical examination and test results as part of my evaluation. I have discussed with other care providers such as pharmacist, RN and Elink.  In addition,  I personally evaluated  patient  The patient is critically ill with multiple organ systems failure and requires high complexity decision making for assessment and support, frequent evaluation and titration of therapies, application of advanced monitoring technologies  and extensive interpretation of multiple databases. She has multiple comorbidities and advanced age   Dr. Newell CoralKristen Scatliffe Pulmonary Critical Care Medicine  07/08/2019 2:06 AM  Pt was withdrawn from care prior to PCCM assuming care and expired at 0203 on 10/25 Death note, Discharge and [facility]  Critical care time: 0 mins

## 2019-07-27 NOTE — ED Provider Notes (Signed)
[facility]  Department of Emergency Medicine   Code Blue CONSULT NOTE  Chief Complaint: Cardiac arrest/unresponsive   Level V Caveat: Unresponsive  History of present illness: I was contacted by the hospital for a CODE BLUE cardiac arrest upstairs and presented to the patient's bedside. Patient was reportedly agonal following NG tube placement this evening.    ROS: Unable to obtain, Level V caveat  Scheduled Meds: . Chlorhexidine Gluconate Cloth  6 each Topical Daily  . enoxaparin (LOVENOX) injection  30 mg Subcutaneous Q24H  . levothyroxine  44 mcg Intravenous Daily  . mouth rinse  15 mL Mouth Rinse BID  . polyethylene glycol  17 g Oral BID  . simethicone  40 mg Oral QID   Continuous Infusions: . sodium chloride    . dextrose 5 % and 0.45 % NaCl with KCl 20 mEq/L 75 mL/hr at 07/19/19 2015  . epinephrine    . famotidine (PEPCID) IV Stopped (07/19/19 2016)  . sodium bicarbonate 150 mEq in dextrose 5% 1000 mL 150 mEq (07/06/2019 0030)  . valproate sodium Stopped (07/19/19 1900)   PRN Meds:.acetaminophen **OR** acetaminophen, albuterol, antiseptic oral rinse, glycopyrrolate **OR** glycopyrrolate **OR** glycopyrrolate, haloperidol **OR** haloperidol **OR** haloperidol lactate, iohexol, LORazepam **OR** LORazepam **OR** LORazepam, morphine injection, ondansetron **OR** ondansetron (ZOFRAN) IV, ondansetron **OR** ondansetron (ZOFRAN) IV, polyvinyl alcohol Past Medical History:  Diagnosis Date  . Anxiety   . Dementia (HCC)   . Hypercholesteremia   . Hypertension   . Osteoporosis   . Thyroid disease    Past Surgical History:  Procedure Laterality Date  . ABDOMINAL HYSTERECTOMY    . APPENDECTOMY    . BREAST BIOPSY Left   . excision of meckel diverticulum    . IM Nailing Hip fracture    . salpingophorectomy     Social History   Socioeconomic History  . Marital status: Widowed    Spouse name: Not on file  . Number of children: Not on file  . Years of  education: Not on file  . Highest education level: Not on file  Occupational History  . Not on file  Social Needs  . Financial resource strain: Not on file  . Food insecurity    Worry: Not on file    Inability: Not on file  . Transportation needs    Medical: Not on file    Non-medical: Not on file  Tobacco Use  . Smoking status: Never Smoker  . Smokeless tobacco: Never Used  Substance and Sexual Activity  . Alcohol use: Not on file    Comment: No  . Drug use: Not on file    Comment: No  . Sexual activity: Not on file  Lifestyle  . Physical activity    Days per week: Not on file    Minutes per session: Not on file  . Stress: Not on file  Relationships  . Social Musician on phone: Not on file    Gets together: Not on file    Attends religious service: Not on file    Active member of club or organization: Not on file    Attends meetings of clubs or organizations: Not on file    Relationship status: Not on file  . Intimate partner violence    Fear of current or ex partner: Not on file    Emotionally abused: Not on file    Physically abused: Not on file    Forced sexual activity: Not on file  Other Topics  Concern  . Not on file  Social History Narrative  . Not on file   Allergies  Allergen Reactions  . Phenytoin     On MAR  . [place] Venom [Bee Venom]     Last set of Vital Signs (not current) Vitals:   07/19/19 2345 07/19/19 2350  BP: (!) 103/35 (!) 49/13  Pulse: (!) 49 (!) 39  Resp:    Temp:    SpO2: (!) 78%       Physical Exam  Gen: unresponsive Cardiovascular: pulseless  Resp: apneic. Breath sounds equal bilaterally with bagging  Abd: distended, tense and tympanitic  Neuro: GCS 3, unresponsive to pain  HEENT: No blood in posterior pharynx, gag reflex absent  Neck: No crepitus  Musculoskeletal: No deformity  Skin: mottled     CRITICAL CARE Performed by: Arminda Foglio K Emberlynn Riggan-Rasch Total critical care time: 30 Critical care time  was exclusive of separately billable procedures and treating other patients. Critical care was necessary to treat or prevent imminent or life-threatening deterioration. Critical care was time spent personally by me on the following activities: development of treatment plan with patient and/or surrogate as well as nursing, discussions with consultants, evaluation of patient's response to treatment, examination of patient, obtaining history from patient or surrogate, ordering and performing treatments and interventions, ordering and review of laboratory studies, ordering and review of radiographic studies, pulse oximetry and re-evaluation of patient's condition.  Cardiopulmonary Resuscitation (CPR) Procedure Note  Directed/Performed by: Nickisha Hum K Lipa Knauff-Rasch I personally directed ancillary staff and/or performed CPR in an effort to regain return of spontaneous circulation and to maintain cardiac, neuro and systemic perfusion.    Medical Decision making  Results for orders placed or performed during the hospital encounter of Aug 04, 2019  SARS CORONAVIRUS 2 (TAT 6-24 HRS) Nasopharyngeal Nasopharyngeal Swab   Specimen: Nasopharyngeal Swab  Result Value Ref Range   SARS Coronavirus 2 NEGATIVE NEGATIVE  Culture, blood (routine x 2)   Specimen: BLOOD LEFT HAND  Result Value Ref Range   Specimen Description      BLOOD LEFT HAND Performed at [facility] [address] [place], [place] [address]    Special Requests      BOTTLES DRAWN AEROBIC ONLY Blood Culture results may not be optimal due to an inadequate volume of blood received in culture bottles Performed at [facility] [address], [place], [place] [address]    Culture      NO GROWTH 5 DAYS Performed at [facility], [place] [address], [place], [place] [address]    Report Status 07/14/2019 FINAL   Culture, blood (routine x 2)   Specimen: BLOOD  Result Value Ref Range   Specimen Description       BLOOD LEFT ANTECUBITAL Performed at [facility], [place] [address], [place], [place] [address]    Special Requests      BOTTLES DRAWN AEROBIC AND ANAEROBIC Blood Culture adequate volume Performed at [facility] [address] [place], [place] [address]    Culture      NO GROWTH 5 DAYS Performed at [facility], [place] [address], [place], [place] [address]    Report Status 07/14/2019 FINAL   MRSA PCR Screening   Specimen: Nasal Mucosa; Nasopharyngeal  Result Value Ref Range   MRSA by PCR NEGATIVE NEGATIVE  Culture, Urine   Specimen: Urine, Catheterized  Result Value Ref Range   Specimen Description      URINE,  CATHETERIZED Performed at [facility], [address], [place], [place] [address]    Special Requests      NONE Performed at [facility], [address], [place], [place] [address]    Culture >=100,000 COLONIES/mL ESCHERICHIA COLI (A)    Report Status 07/13/2019 FINAL    Organism ID, Bacteria ESCHERICHIA COLI (A)       Susceptibility   Escherichia coli - MIC*    AMPICILLIN <=2 SENSITIVE Sensitive     CEFAZOLIN <=4 SENSITIVE Sensitive     CEFTRIAXONE <=1 SENSITIVE Sensitive     CIPROFLOXACIN <=0.25 SENSITIVE Sensitive     GENTAMICIN <=1 SENSITIVE Sensitive     IMIPENEM <=0.25 SENSITIVE Sensitive     NITROFURANTOIN <=16 SENSITIVE Sensitive     TRIMETH/SULFA <=20 SENSITIVE Sensitive     AMPICILLIN/SULBACTAM <=2 SENSITIVE Sensitive     PIP/TAZO <=4 SENSITIVE Sensitive     Extended ESBL NEGATIVE Sensitive     * >=100,000 COLONIES/mL ESCHERICHIA COLI  SARS CORONAVIRUS 2 (TAT 6-24 HRS) Nasopharyngeal Nasopharyngeal Swab   Specimen: Nasopharyngeal Swab  Result Value Ref Range   SARS Coronavirus 2 NEGATIVE NEGATIVE  Comprehensive metabolic panel  Result Value Ref Range   Sodium 136 135 - 145 mmol/L   Potassium 6.5 (HH) 3.5 - 5.1 mmol/L   Chloride 106 98 - 111 mmol/L   CO2 19 (L) 22  - 32 mmol/L   Glucose, Bld 91 70 - 99 mg/dL   BUN 74 (H) 8 - 23 mg/dL   Creatinine, Ser 3.01 (H) 0.44 - 1.00 mg/dL   Calcium 8.3 (L) 8.9 - 10.3 mg/dL   Total Protein 5.5 (L) 6.5 - 8.1 g/dL   Albumin 2.6 (L) 3.5 - 5.0 g/dL   AST 14 (L) 15 - 41 U/L   ALT 6 0 - 44 U/L   Alkaline Phosphatase 63 38 - 126 U/L   Total Bilirubin 0.5 0.3 - 1.2 mg/dL   GFR calc non Af Amer 26 (L) >60 mL/min   GFR calc Af Amer 31 (L) >60 mL/min   Anion gap 11 5 - 15  Urinalysis, Routine w reflex microscopic  Result Value Ref Range   Color, Urine AMBER (A) YELLOW   APPearance CLEAR CLEAR   Specific Gravity, Urine 1.020 1.005 - 1.030   pH 5.0 5.0 - 8.0   Glucose, UA NEGATIVE NEGATIVE mg/dL   Hgb urine dipstick NEGATIVE NEGATIVE   Bilirubin Urine SMALL (A) NEGATIVE   Ketones, ur 5 (A) NEGATIVE mg/dL   Protein, ur NEGATIVE NEGATIVE mg/dL   Nitrite NEGATIVE NEGATIVE   Leukocytes,Ua NEGATIVE NEGATIVE  CBC with Differential/Platelet  Result Value Ref Range   WBC 12.7 (H) 4.0 - 10.5 K/uL   RBC 3.90 3.87 - 5.11 MIL/uL   Hemoglobin 11.7 (L) 12.0 - 15.0 g/dL   HCT 60.1 09.3 - 23.5 %   MCV 96.2 80.0 - 100.0 fL   MCH 30.0 26.0 - 34.0 pg   MCHC 31.2 30.0 - 36.0 g/dL   RDW 57.3 22.0 - 25.4 %   Platelets 258 150 - 400 K/uL   nRBC 0.0 0.0 - 0.2 %   Neutrophils Relative % 49 %   Neutro Abs 9.4 (H) 1.7 - 7.7 K/uL   Band Neutrophils 25 %   Lymphocytes Relative 16 %   Lymphs Abs 2.0 0.7 - 4.0 K/uL   Monocytes Relative 10 %   Monocytes Absolute 1.3 (H) 0.1 - 1.0 K/uL   Eosinophils Relative 0 %  Eosinophils Absolute 0.0 0.0 - 0.5 K/uL   Basophils Relative 0 %   Basophils Absolute 0.0 0.0 - 0.1 K/uL   WBC Morphology MILD LEFT SHIFT (1-5% METAS, OCC MYELO, OCC BANDS)    Abs Immature Granulocytes 0.00 0.00 - 0.07 K/uL  Basic metabolic panel  Result Value Ref Range   Sodium 140 135 - 145 mmol/L   Potassium 4.5 3.5 - 5.1 mmol/L   Chloride 110 98 - 111 mmol/L   CO2 22 22 - 32 mmol/L   Glucose, Bld 61 (L) 70 - 99  mg/dL   BUN 67 (H) 8 - 23 mg/dL   Creatinine, Ser 1.61 (H) 0.44 - 1.00 mg/dL   Calcium 7.6 (L) 8.9 - 10.3 mg/dL   GFR calc non Af Amer 30 (L) >60 mL/min   GFR calc Af Amer 35 (L) >60 mL/min   Anion gap 8 5 - 15  TSH  Result Value Ref Range   TSH 5.570 (H) 0.350 - 4.500 uIU/mL  T4, free  Result Value Ref Range   Free T4 1.13 (H) 0.61 - 1.12 ng/dL  Glucose, capillary  Result Value Ref Range   Glucose-Capillary 75 70 - 99 mg/dL  Comprehensive metabolic panel  Result Value Ref Range   Sodium 139 135 - 145 mmol/L   Potassium 2.7 (LL) 3.5 - 5.1 mmol/L   Chloride 112 (H) 98 - 111 mmol/L   CO2 18 (L) 22 - 32 mmol/L   Glucose, Bld 96 70 - 99 mg/dL   BUN 48 (H) 8 - 23 mg/dL   Creatinine, Ser 0.96 (H) 0.44 - 1.00 mg/dL   Calcium 7.4 (L) 8.9 - 10.3 mg/dL   Total Protein 4.5 (L) 6.5 - 8.1 g/dL   Albumin 2.1 (L) 3.5 - 5.0 g/dL   AST 11 (L) 15 - 41 U/L   ALT 7 0 - 44 U/L   Alkaline Phosphatase 50 38 - 126 U/L   Total Bilirubin 0.7 0.3 - 1.2 mg/dL   GFR calc non Af Amer 50 (L) >60 mL/min   GFR calc Af Amer 58 (L) >60 mL/min   Anion gap 9 5 - 15  CBC  Result Value Ref Range   WBC 6.4 4.0 - 10.5 K/uL   RBC 3.01 (L) 3.87 - 5.11 MIL/uL   Hemoglobin 8.9 (L) 12.0 - 15.0 g/dL   HCT 04.5 (L) 40.9 - 81.1 %   MCV 94.0 80.0 - 100.0 fL   MCH 29.6 26.0 - 34.0 pg   MCHC 31.4 30.0 - 36.0 g/dL   RDW 91.4 78.2 - 95.6 %   Platelets 203 150 - 400 K/uL   nRBC 0.0 0.0 - 0.2 %  Glucose, capillary  Result Value Ref Range   Glucose-Capillary 91 70 - 99 mg/dL  Glucose, capillary  Result Value Ref Range   Glucose-Capillary 105 (H) 70 - 99 mg/dL  Glucose, capillary  Result Value Ref Range   Glucose-Capillary 106 (H) 70 - 99 mg/dL  Glucose, capillary  Result Value Ref Range   Glucose-Capillary 84 70 - 99 mg/dL  Glucose, capillary  Result Value Ref Range   Glucose-Capillary 83 70 - 99 mg/dL  Basic metabolic panel  Result Value Ref Range   Sodium 142 135 - 145 mmol/L   Potassium 3.6 3.5 - 5.1  mmol/L   Chloride 115 (H) 98 - 111 mmol/L   CO2 20 (L) 22 - 32 mmol/L   Glucose, Bld 93 70 - 99 mg/dL   BUN 25 (H) 8 -  23 mg/dL   Creatinine, Ser 1.61 0.44 - 1.00 mg/dL   Calcium 8.1 (L) 8.9 - 10.3 mg/dL   GFR calc non Af Amer >60 >60 mL/min   GFR calc Af Amer >60 >60 mL/min   Anion gap 7 5 - 15  CBC  Result Value Ref Range   WBC 7.3 4.0 - 10.5 K/uL   RBC 2.93 (L) 3.87 - 5.11 MIL/uL   Hemoglobin 8.8 (L) 12.0 - 15.0 g/dL   HCT 09.6 (L) 04.5 - 40.9 %   MCV 95.6 80.0 - 100.0 fL   MCH 30.0 26.0 - 34.0 pg   MCHC 31.4 30.0 - 36.0 g/dL   RDW 81.1 91.4 - 78.2 %   Platelets 174 150 - 400 K/uL   nRBC 0.0 0.0 - 0.2 %  Glucose, capillary  Result Value Ref Range   Glucose-Capillary 108 (H) 70 - 99 mg/dL   Comment 1 Notify RN   Glucose, capillary  Result Value Ref Range   Glucose-Capillary 82 70 - 99 mg/dL   Comment 1 Notify RN   Glucose, capillary  Result Value Ref Range   Glucose-Capillary 116 (H) 70 - 99 mg/dL  Glucose, capillary  Result Value Ref Range   Glucose-Capillary 112 (H) 70 - 99 mg/dL  Glucose, capillary  Result Value Ref Range   Glucose-Capillary 90 70 - 99 mg/dL  CBC  Result Value Ref Range   WBC 6.7 4.0 - 10.5 K/uL   RBC 3.36 (L) 3.87 - 5.11 MIL/uL   Hemoglobin 9.9 (L) 12.0 - 15.0 g/dL   HCT 95.6 (L) 21.3 - 08.6 %   MCV 94.0 80.0 - 100.0 fL   MCH 29.5 26.0 - 34.0 pg   MCHC 31.3 30.0 - 36.0 g/dL   RDW 57.8 46.9 - 62.9 %   Platelets 232 150 - 400 K/uL   nRBC 0.0 0.0 - 0.2 %  Basic metabolic panel  Result Value Ref Range   Sodium 139 135 - 145 mmol/L   Potassium 3.0 (L) 3.5 - 5.1 mmol/L   Chloride 105 98 - 111 mmol/L   CO2 26 22 - 32 mmol/L   Glucose, Bld 118 (H) 70 - 99 mg/dL   BUN 13 8 - 23 mg/dL   Creatinine, Ser 5.28 0.44 - 1.00 mg/dL   Calcium 7.9 (L) 8.9 - 10.3 mg/dL   GFR calc non Af Amer >60 >60 mL/min   GFR calc Af Amer >60 >60 mL/min   Anion gap 8 5 - 15  Glucose, capillary  Result Value Ref Range   Glucose-Capillary 118 (H) 70 - 99 mg/dL   Glucose, capillary  Result Value Ref Range   Glucose-Capillary 94 70 - 99 mg/dL  Glucose, capillary  Result Value Ref Range   Glucose-Capillary 85 70 - 99 mg/dL  Glucose, capillary  Result Value Ref Range   Glucose-Capillary 103 (H) 70 - 99 mg/dL  Glucose, capillary  Result Value Ref Range   Glucose-Capillary 125 (H) 70 - 99 mg/dL  Glucose, capillary  Result Value Ref Range   Glucose-Capillary 104 (H) 70 - 99 mg/dL  Glucose, capillary  Result Value Ref Range   Glucose-Capillary 111 (H) 70 - 99 mg/dL  Comprehensive metabolic panel  Result Value Ref Range   Sodium 138 135 - 145 mmol/L   Potassium 3.5 3.5 - 5.1 mmol/L   Chloride 106 98 - 111 mmol/L   CO2 25 22 - 32 mmol/L   Glucose, Bld 132 (H) 70 - 99 mg/dL  BUN 26 (H) 8 - 23 mg/dL   Creatinine, Ser 1.61 0.44 - 1.00 mg/dL   Calcium 7.9 (L) 8.9 - 10.3 mg/dL   Total Protein 4.4 (L) 6.5 - 8.1 g/dL   Albumin 1.7 (L) 3.5 - 5.0 g/dL   AST 9 (L) 15 - 41 U/L   ALT 7 0 - 44 U/L   Alkaline Phosphatase 60 38 - 126 U/L   Total Bilirubin 0.4 0.3 - 1.2 mg/dL   GFR calc non Af Amer >60 >60 mL/min   GFR calc Af Amer >60 >60 mL/min   Anion gap 7 5 - 15  CBC  Result Value Ref Range   WBC 8.3 4.0 - 10.5 K/uL   RBC 2.76 (L) 3.87 - 5.11 MIL/uL   Hemoglobin 8.2 (L) 12.0 - 15.0 g/dL   HCT 09.6 (L) 04.5 - 40.9 %   MCV 94.2 80.0 - 100.0 fL   MCH 29.7 26.0 - 34.0 pg   MCHC 31.5 30.0 - 36.0 g/dL   RDW 81.1 91.4 - 78.2 %   Platelets 249 150 - 400 K/uL   nRBC 0.0 0.0 - 0.2 %  Glucose, capillary  Result Value Ref Range   Glucose-Capillary 122 (H) 70 - 99 mg/dL  Glucose, capillary  Result Value Ref Range   Glucose-Capillary 105 (H) 70 - 99 mg/dL  Comprehensive metabolic panel  Result Value Ref Range   Sodium 136 135 - 145 mmol/L   Potassium 3.5 3.5 - 5.1 mmol/L   Chloride 106 98 - 111 mmol/L   CO2 21 (L) 22 - 32 mmol/L   Glucose, Bld 126 (H) 70 - 99 mg/dL   BUN 33 (H) 8 - 23 mg/dL   Creatinine, Ser 9.56 0.44 - 1.00 mg/dL   Calcium  8.2 (L) 8.9 - 10.3 mg/dL   Total Protein 4.9 (L) 6.5 - 8.1 g/dL   Albumin 1.9 (L) 3.5 - 5.0 g/dL   AST 12 (L) 15 - 41 U/L   ALT 7 0 - 44 U/L   Alkaline Phosphatase 66 38 - 126 U/L   Total Bilirubin 0.6 0.3 - 1.2 mg/dL   GFR calc non Af Amer >60 >60 mL/min   GFR calc Af Amer >60 >60 mL/min   Anion gap 9 5 - 15  Magnesium  Result Value Ref Range   Magnesium 2.2 1.7 - 2.4 mg/dL  Lipase, blood  Result Value Ref Range   Lipase 23 11 - 51 U/L  Lactic acid, plasma  Result Value Ref Range   Lactic Acid, Venous 1.6 0.5 - 1.9 mmol/L  Glucose, capillary  Result Value Ref Range   Glucose-Capillary 106 (H) 70 - 99 mg/dL  Occult blood card to lab, stool  Result Value Ref Range   Fecal Occult Bld POSITIVE (A) NEGATIVE  Glucose, capillary  Result Value Ref Range   Glucose-Capillary 108 (H) 70 - 99 mg/dL  CBC with Differential/Platelet  Result Value Ref Range   WBC 11.4 (H) 4.0 - 10.5 K/uL   RBC 2.81 (L) 3.87 - 5.11 MIL/uL   Hemoglobin 8.2 (L) 12.0 - 15.0 g/dL   HCT 21.3 (L) 08.6 - 57.8 %   MCV 94.3 80.0 - 100.0 fL   MCH 29.2 26.0 - 34.0 pg   MCHC 30.9 30.0 - 36.0 g/dL   RDW 46.9 (H) 62.9 - 52.8 %   Platelets 308 150 - 400 K/uL   nRBC 0.0 0.0 - 0.2 %   Neutrophils Relative % 82 %   Neutro Abs  9.2 (H) 1.7 - 7.7 K/uL   Lymphocytes Relative 13 %   Lymphs Abs 1.5 0.7 - 4.0 K/uL   Monocytes Relative 5 %   Monocytes Absolute 0.5 0.1 - 1.0 K/uL   Eosinophils Relative 0 %   Eosinophils Absolute 0.0 0.0 - 0.5 K/uL   Basophils Relative 0 %   Basophils Absolute 0.0 0.0 - 0.1 K/uL   Immature Granulocytes 0 %   Abs Immature Granulocytes 0.04 0.00 - 0.07 K/uL  Basic metabolic panel  Result Value Ref Range   Sodium 139 135 - 145 mmol/L   Potassium 3.7 3.5 - 5.1 mmol/L   Chloride 107 98 - 111 mmol/L   CO2 24 22 - 32 mmol/L   Glucose, Bld 113 (H) 70 - 99 mg/dL   BUN 33 (H) 8 - 23 mg/dL   Creatinine, Ser 7.82 0.44 - 1.00 mg/dL   Calcium 8.2 (L) 8.9 - 10.3 mg/dL   GFR calc non Af Amer >60  >60 mL/min   GFR calc Af Amer >60 >60 mL/min   Anion gap 8 5 - 15  Magnesium  Result Value Ref Range   Magnesium 2.0 1.7 - 2.4 mg/dL  CBC with Differential/Platelet  Result Value Ref Range   WBC 11.3 (H) 4.0 - 10.5 K/uL   RBC 2.87 (L) 3.87 - 5.11 MIL/uL   Hemoglobin 8.4 (L) 12.0 - 15.0 g/dL   HCT 42.3 (L) 53.6 - 14.4 %   MCV 96.5 80.0 - 100.0 fL   MCH 29.3 26.0 - 34.0 pg   MCHC 30.3 30.0 - 36.0 g/dL   RDW 31.5 (H) 40.0 - 86.7 %   Platelets 321 150 - 400 K/uL   nRBC 0.0 0.0 - 0.2 %   Neutrophils Relative % 84 %   Neutro Abs 9.5 (H) 1.7 - 7.7 K/uL   Lymphocytes Relative 12 %   Lymphs Abs 1.3 0.7 - 4.0 K/uL   Monocytes Relative 3 %   Monocytes Absolute 0.3 0.1 - 1.0 K/uL   Eosinophils Relative 0 %   Eosinophils Absolute 0.0 0.0 - 0.5 K/uL   Basophils Relative 0 %   Basophils Absolute 0.1 0.0 - 0.1 K/uL   Immature Granulocytes 1 %   Abs Immature Granulocytes 0.07 0.00 - 0.07 K/uL  Comprehensive metabolic panel  Result Value Ref Range   Sodium 141 135 - 145 mmol/L   Potassium 3.7 3.5 - 5.1 mmol/L   Chloride 108 98 - 111 mmol/L   CO2 23 22 - 32 mmol/L   Glucose, Bld 126 (H) 70 - 99 mg/dL   BUN 39 (H) 8 - 23 mg/dL   Creatinine, Ser 6.19 0.44 - 1.00 mg/dL   Calcium 8.6 (L) 8.9 - 10.3 mg/dL   Total Protein 5.1 (L) 6.5 - 8.1 g/dL   Albumin 2.0 (L) 3.5 - 5.0 g/dL   AST 18 15 - 41 U/L   ALT 12 0 - 44 U/L   Alkaline Phosphatase 70 38 - 126 U/L   Total Bilirubin 0.8 0.3 - 1.2 mg/dL   GFR calc non Af Amer >60 >60 mL/min   GFR calc Af Amer >60 >60 mL/min   Anion gap 10 5 - 15  Lactic acid, plasma  Result Value Ref Range   Lactic Acid, Venous 1.9 0.5 - 1.9 mmol/L  Magnesium  Result Value Ref Range   Magnesium 2.3 1.7 - 2.4 mg/dL  Glucose, capillary  Result Value Ref Range   Glucose-Capillary 101 (H) 70 - 99 mg/dL  Comment 1 Notify RN    Comment 2 Document in Chart   Glucose, capillary  Result Value Ref Range   Glucose-Capillary 97 70 - 99 mg/dL  Glucose, capillary   Result Value Ref Range   Glucose-Capillary 81 70 - 99 mg/dL  Glucose, capillary  Result Value Ref Range   Glucose-Capillary 80 70 - 99 mg/dL  Glucose, capillary  Result Value Ref Range   Glucose-Capillary 87 70 - 99 mg/dL  Glucose, capillary  Result Value Ref Range   Glucose-Capillary 130 (H) 70 - 99 mg/dL   Comment 1 Notify RN    Comment 2 Document in Chart   CBC  Result Value Ref Range   WBC 8.7 4.0 - 10.5 K/uL   RBC 2.69 (L) 3.87 - 5.11 MIL/uL   Hemoglobin 8.1 (L) 12.0 - 15.0 g/dL   HCT 40.925.9 (L) 81.136.0 - 91.446.0 %   MCV 96.3 80.0 - 100.0 fL   MCH 30.1 26.0 - 34.0 pg   MCHC 31.3 30.0 - 36.0 g/dL   RDW 78.216.2 (H) 95.611.5 - 21.315.5 %   Platelets 385 150 - 400 K/uL   nRBC 0.0 0.0 - 0.2 %  Basic metabolic panel  Result Value Ref Range   Sodium 144 135 - 145 mmol/L   Potassium 3.3 (L) 3.5 - 5.1 mmol/L   Chloride 110 98 - 111 mmol/L   CO2 26 22 - 32 mmol/L   Glucose, Bld 103 (H) 70 - 99 mg/dL   BUN 29 (H) 8 - 23 mg/dL   Creatinine, Ser 0.860.70 0.44 - 1.00 mg/dL   Calcium 8.5 (L) 8.9 - 10.3 mg/dL   GFR calc non Af Amer >60 >60 mL/min   GFR calc Af Amer >60 >60 mL/min   Anion gap 8 5 - 15  Magnesium  Result Value Ref Range   Magnesium 2.3 1.7 - 2.4 mg/dL  Glucose, capillary  Result Value Ref Range   Glucose-Capillary 74 70 - 99 mg/dL  Glucose, capillary  Result Value Ref Range   Glucose-Capillary 114 (H) 70 - 99 mg/dL  Glucose, capillary  Result Value Ref Range   Glucose-Capillary 150 (H) 70 - 99 mg/dL  Glucose, capillary  Result Value Ref Range   Glucose-Capillary 284 (H) 70 - 99 mg/dL  Glucose, capillary  Result Value Ref Range   Glucose-Capillary 275 (H) 70 - 99 mg/dL  CBC  Result Value Ref Range   WBC 9.2 4.0 - 10.5 K/uL   RBC 2.48 (L) 3.87 - 5.11 MIL/uL   Hemoglobin 7.4 (L) 12.0 - 15.0 g/dL   HCT 57.825.5 (L) 46.936.0 - 62.946.0 %   MCV 102.8 (H) 80.0 - 100.0 fL   MCH 29.8 26.0 - 34.0 pg   MCHC 29.0 (L) 30.0 - 36.0 g/dL   RDW 52.816.5 (H) 41.311.5 - 24.415.5 %   Platelets 222 150 - 400  K/uL   nRBC 0.9 (H) 0.0 - 0.2 %  Comprehensive metabolic panel  Result Value Ref Range   Sodium 148 (H) 135 - 145 mmol/L   Potassium 4.8 3.5 - 5.1 mmol/L   Chloride 109 98 - 111 mmol/L   CO2 23 22 - 32 mmol/L   Glucose, Bld 265 (H) 70 - 99 mg/dL   BUN 24 (H) 8 - 23 mg/dL   Creatinine, Ser 0.101.05 (H) 0.44 - 1.00 mg/dL   Calcium 27.212.0 (H) 8.9 - 10.3 mg/dL   Total Protein 4.2 (L) 6.5 - 8.1 g/dL   Albumin 1.5 (L) 3.5 - 5.0 g/dL  AST 145 (H) 15 - 41 U/L   ALT <5 0 - 44 U/L   Alkaline Phosphatase 65 38 - 126 U/L   Total Bilirubin 0.3 0.3 - 1.2 mg/dL   GFR calc non Af Amer 50 (L) >60 mL/min   GFR calc Af Amer 58 (L) >60 mL/min   Anion gap 16 (H) 5 - 15  Lactic acid, plasma  Result Value Ref Range   Lactic Acid, Venous >11.0 (HH) 0.5 - 1.9 mmol/L  Magnesium  Result Value Ref Range   Magnesium 3.0 (H) 1.7 - 2.4 mg/dL  CBG monitoring, ED  Result Value Ref Range   Glucose-Capillary 158 (H) 70 - 99 mg/dL  CBG monitoring, ED  Result Value Ref Range   Glucose-Capillary 59 (L) 70 - 99 mg/dL   Ct Abdomen Pelvis Wo Contrast  Result Date: 07/04/2019 CLINICAL DATA:  Abdominal pain EXAM: CT ABDOMEN AND PELVIS WITHOUT CONTRAST TECHNIQUE: Multidetector CT imaging of the abdomen and pelvis was performed following the standard protocol without IV contrast. COMPARISON:  11/08/2018 FINDINGS: Lower chest: No acute abnormality. Hepatobiliary: No focal hepatic abnormality. Gallbladder unremarkable. Pancreas: No focal abnormality or ductal dilatation. Spleen: No focal abnormality.  Normal size. Adrenals/Urinary Tract: No visible focal renal or adrenal mass. No hydronephrosis. Urinary bladder unremarkable. Stomach/Bowel: Marked distention of the rectosigmoid colon with large stool burden concerning for fecal impaction. Stomach and small bowel decompressed, grossly unremarkable. Vascular/Lymphatic: Aortic atherosclerosis. No enlarged abdominal or pelvic lymph nodes. Reproductive: Prior hysterectomy.  No adnexal  masses. Other: No free fluid or free air. Musculoskeletal: Chronic L1 compression fracture. No acute bony abnormality. IMPRESSION: Large stool burden and distention of the rectosigmoid colon concerning for fecal impaction. Otherwise no acute findings in the abdomen or pelvis. Aortic atherosclerosis. Electronically Signed   By: Charlett Nose M.D.   On: 07/21/2019 08:54   Dg Abd 1 View  Result Date: July 22, 2019 CLINICAL DATA:  83 year old female with NG tube placement. EXAM: ABDOMEN - 1 VIEW COMPARISON:  Earlier radiograph dated 07/19/2019 FINDINGS: Slight interval advancement of the NG tube with side-port at or just distal to the GE junction and tip in the proximal stomach. Further advancement of the tube by an additional 4-5 cm, if possible, recommended for optimal positioning. Dilated colon similar to prior radiographs. IMPRESSION: Minimal interval advancement of the NG tube since the prior radiograph. Electronically Signed   By: Elgie Collard M.D.   On: 07/22/19 00:10   Dg Abd 1 View  Result Date: 07/19/2019 CLINICAL DATA:  83 year old female status post NG tube placement. EXAM: ABDOMEN - 1 VIEW COMPARISON:  Earlier radiograph dated 07/19/2019 and CT of the abdomen pelvis dated 07/19/2019 FINDINGS: Partially visualized enteric tube with side-port in the region of the gastroesophageal junction and tip in the upper abdomen likely in the proximal stomach. Recommend further advancing of the tube by an additional 5 cm for optimal positioning. Dilated air-filled loops of colon again noted. IMPRESSION: Enteric tube with side-port in the region of the gastroesophageal junction and tip in the upper abdomen likely in the proximal stomach. Recommend further advancing of the tube by an additional 5 cm for optimal positioning. Electronically Signed   By: Elgie Collard M.D.   On: 07/19/2019 23:21   Dg Abd 1 View  Result Date: 07/19/2019 CLINICAL DATA:  NG tube placement EXAM: ABDOMEN - 1 VIEW COMPARISON:   None. FINDINGS: Tip the NG tube is is seen projecting coiled again above the level of the diaphragm, likely within a hiatal hernia  with the tip projecting at the GE junction. Again noted are markedly dilated air-filled loops of bowel throughout the abdomen. IMPRESSION: Tip the NG tube again seen above the level of the diaphragm with the tip likely projected at the GE junction as on prior exam. Electronically Signed   By: Jonna Clark M.D.   On: 07/19/2019 22:21   Dg Abd 1 View  Result Date: 07/19/2019 CLINICAL DATA:  83 year old female status post NG placement. EXAM: ABDOMEN - 1 VIEW COMPARISON:  Earlier radiograph dated 07/18/2019 FINDINGS: Partially visualized enteric tube appears kinked at the side port. The side port of the tube is located above the diaphragm likely in the hiatal hernia and the tip is in the region of the gastroesophageal junction. Recommended repositioning. Distended colon similar to prior radiograph and CT. IMPRESSION: 1. Enteric tube with side-port in the hiatal hernia and tip close to the GE junction. The tube appears kinked at the side port within the hernia. Recommended repositioning. 2. Distended colon similar to prior radiograph. Electronically Signed   By: Elgie Collard M.D.   On: 07/19/2019 20:45   Ct Abdomen Pelvis W Contrast  Result Date: 07/19/2019 CLINICAL DATA:  Evaluate abdominal distension. EXAM: CT ABDOMEN AND PELVIS WITH CONTRAST TECHNIQUE: Multidetector CT imaging of the abdomen and pelvis was performed using the standard protocol following bolus administration of intravenous contrast. CONTRAST:  OMNIPAQUE IOHEXOL 300 MG/ML  SOLN COMPARISON:  2019-08-01 FINDINGS: Lower chest: Small bilateral pleural effusions identified left greater than right. These are new from previous exam. Hepatobiliary: No focal liver abnormality identified. Mild periportal edema identified. The gallbladder is unremarkable. No gallbladder wall inflammation. No significant bile duct  dilatation. Pancreas: Unremarkable. No pancreatic ductal dilatation or surrounding inflammatory changes. Spleen: Normal in size without focal abnormality. Adrenals/Urinary Tract: Normal adrenal glands. Asymmetric left renal atrophy. Bilateral kidney lesions of varying size are identified most of these are too small to reliably characterize but likely represent simple cysts. No enhancing mass noted at this time. Stomach/Bowel: The distal esophagus is distended with air-fluid level. There is also distension of the gastric fundus. No significant small bowel dilatation, inflammation or wall thickening. Marked distension of the colon from the cecum to the level of the rectum. Liquefied stool with air-fluid levels are identified throughout the dilated bowel loops. The previous large volume desiccated stool ball involving the sigmoid colon and rectum now appears fragmented and decreased in size. Vascular/Lymphatic: Aortic atherosclerosis. No aneurysm. No abdominopelvic adenopathy. Reproductive: Status post hysterectomy. No adnexal masses. Other: No free fluid or fluid collections. No pneumoperitoneum identified. Musculoskeletal: Previous hardware fixation of the proximal right femur. Unchanged appearance of fracture deformity involving L1 vertebra appear IMPRESSION: 1. Marked distension of the colon from the cecum to the level of the rectum. Liquefied stool with air-fluid levels identified throughout the dilated bowel loops. Findings may reflect colonic pseudoobstruction secondary to oral cathartics. 2. The previous large volume desiccated stool ball involving the sigmoid colon and rectum now appears fragmented and decreased in size from previous exam. 3. New small bilateral pleural effusions left greater than right. Aortic Atherosclerosis (ICD10-I70.0). Electronically Signed   By: Signa Kell M.D.   On: 07/19/2019 11:25   Dg Chest Port 1 View  Result Date: 2019/08/01 CLINICAL DATA:  Leukocytosis. EXAM: PORTABLE  CHEST 1 VIEW COMPARISON:  Chest CT scan dated 11/08/2018 FINDINGS: The heart size and pulmonary vascularity are normal. Aortic atherosclerosis. Multiple old healed right posterior rib fractures with adjacent pleural thickening as demonstrated on the prior  CT scan. No infiltrates or effusions. No acute bone abnormality. IMPRESSION: 1. No acute abnormalities. 2. Aortic atherosclerosis. 3. Multiple old healed right posterior rib fractures with adjacent pleural thickening. Electronically Signed   By: Francene Boyers M.D.   On: 07/13/2019 11:40   Dg Abd Portable 1v  Result Date: 07/18/2019 CLINICAL DATA:  Abdomen pain and distension EXAM: PORTABLE ABDOMEN - 1 VIEW COMPARISON:  07/17/2019, 07/16/2019, 07/15/2019 FINDINGS: Persistent large rectosigmoid stool burden. Continued gaseous dilatation of the colon, appears slightly decreased. IMPRESSION: Gaseous dilatation of colon appears slightly decreased, persistent large rectosigmoid stool burden. Electronically Signed   By: Jasmine Pang M.D.   On: 07/18/2019 16:26   Dg Abd Portable 1v  Result Date: 07/17/2019 CLINICAL DATA:  Constipation. EXAM: PORTABLE ABDOMEN - 1 VIEW COMPARISON:  Radiograph yesterday. CT 07/24/2019 FINDINGS: Large rectosigmoid stool burden, however slight decrease from yesterday. There is unchanged gaseous distention of colon. No small bowel dilatation. No evidence of free air. IMPRESSION: 1. Large rectosigmoid stool burden, however slight decrease from yesterday. 2. Unchanged gaseous colonic distension. Electronically Signed   By: Narda Rutherford M.D.   On: 07/17/2019 05:57   Dg Abd Portable 1v  Result Date: 07/16/2019 CLINICAL DATA:  Abdominal distension, nausea and vomiting this morning. EXAM: PORTABLE ABDOMEN - 1 VIEW COMPARISON:  Single-view of the abdomen 07/15/2019. CT abdomen and pelvis 07/08/2019. FINDINGS: There is gaseous distention of the colon. A massive volume of stool and is again seen in the rectosigmoid colon. Small bowel  is unremarkable. IMPRESSION: Massive volume of stool in the rectosigmoid colon. Gaseous distention of the colon is worrisome for secondary obstruction or ileus. Electronically Signed   By: Drusilla Kanner M.D.   On: 07/16/2019 08:46   Dg Abd Portable 1v  Result Date: 07/15/2019 CLINICAL DATA:  Diarrhea and fevers EXAM: PORTABLE ABDOMEN - 1 VIEW COMPARISON:  07/26/2019 FINDINGS: Scattered large and small bowel gas is noted. Changes consistent with prominent stool burden in the rectosigmoid and fecal impaction are again seen similar to that noted on prior CT. No free air is noted. IMPRESSION: Stable changes of impaction. Electronically Signed   By: Alcide Clever M.D.   On: 07/15/2019 12:58   Vas [place] Lower Extremity Venous (dvt)  Result Date: 07/19/2019  Lower Venous Study Indications: Edema, and Pain.  Comparison Study: no prior Performing Technologist: Blanch Media RVS  Examination Guidelines: A complete evaluation includes B-mode imaging, spectral Doppler, color Doppler, and power Doppler as needed of all accessible portions of each vessel. Bilateral testing is considered an integral part of a complete examination. Limited examinations for reoccurring indications may be performed as noted.  +---------+---------------+---------+-----------+----------+--------------+ RIGHT    CompressibilityPhasicitySpontaneityPropertiesThrombus Aging +---------+---------------+---------+-----------+----------+--------------+ CFV      Full           Yes      Yes                                 +---------+---------------+---------+-----------+----------+--------------+ SFJ      Full                                                        +---------+---------------+---------+-----------+----------+--------------+ FV Prox  Full                                                        +---------+---------------+---------+-----------+----------+--------------+  FV Mid   Full                                                         +---------+---------------+---------+-----------+----------+--------------+ FV DistalFull                                                        +---------+---------------+---------+-----------+----------+--------------+ PFV      Full                                                        +---------+---------------+---------+-----------+----------+--------------+ POP      Full           Yes      Yes                                 +---------+---------------+---------+-----------+----------+--------------+ PTV      Full                                                        +---------+---------------+---------+-----------+----------+--------------+ PERO     Full                                                        +---------+---------------+---------+-----------+----------+--------------+   +---------+---------------+---------+-----------+----------+--------------+ LEFT     CompressibilityPhasicitySpontaneityPropertiesThrombus Aging +---------+---------------+---------+-----------+----------+--------------+ CFV      Full           Yes      Yes                                 +---------+---------------+---------+-----------+----------+--------------+ SFJ      Full                                                        +---------+---------------+---------+-----------+----------+--------------+ FV Prox  Full                                                        +---------+---------------+---------+-----------+----------+--------------+ FV Mid   Full                                                        +---------+---------------+---------+-----------+----------+--------------+  FV DistalFull                                                        +---------+---------------+---------+-----------+----------+--------------+ PFV      Full                                                         +---------+---------------+---------+-----------+----------+--------------+ POP      Full           Yes      Yes                                 +---------+---------------+---------+-----------+----------+--------------+ PTV      Full                                                        +---------+---------------+---------+-----------+----------+--------------+ PERO                                                  Not visualized +---------+---------------+---------+-----------+----------+--------------+     Summary: Right: There is no evidence of deep vein thrombosis in the lower extremity. No cystic structure found in the popliteal fossa. Left: There is no evidence of deep vein thrombosis in the lower extremity. No cystic structure found in the popliteal fossa.  *See table(s) above for measurements and observations. Electronically signed by Sherald Hess MD on 07/19/2019 at 6:36:51 PM.    Final     Wide complex on monitor likely hyperkalemia, calcium chloride given Patient mottle likely long down time, bicarb 2 amps given and bicarb drip started   Assessment and Plan  Patient is s/p arrest, long down time.  Intubated by anesthesia.  Aspirate in the ETT, the patient has aspirated. Will need transfer to the ICU for further care.     Lennie Vasco, MD 07/24/2019 1610

## 2019-07-27 NOTE — Progress Notes (Signed)
Patient time of death 0203 2019/08/14. Confirmed with Lorretta Harp RN. Family was present at the bedside at time of death. Patient death certificate to be signed by primary [facility] attending.

## 2019-07-27 NOTE — Death Summary Note (Signed)
[facility] Death Summary   Patient: Caryn Gienger ZOX:[id number]   PCP: Margit Hanks, MD DOB: 1935-11-15   Date of admission: 2019/08/03   Date and time of death: 14-Aug-2019 @ 02:03AM   Hospital Diagnoses:  Cause of death Colonic ileus  Principal Problem:   Diarrhea with dehydration Active Problems:   Hypertension   Hypothyroidism   Depression, major, single episode, in partial remission (HCC)   Dementia with behavioral disturbance (HCC)   Pressure injury of skin  History of present illness: As per the H and P dictated on admission, " Ilda Laskin is a 83 y.o. female with medical history significant for advanced dementia from memory care unit, hypothyroidism, hypertension, anxiety was brought from memory care unit with complaints of reported fever and diarrhea for about a day.  Limited history from both the memory care unit as well as patient secondary to dementia.  Patient is alert, mumbles incoherently, not able to answer any questions.  Level 5 caveat  ED Course: Afebrile, mildly hypotensive, saturating well on room air, labs show WBC 12.7, hyperkalemia with potassium 6.5, AKI with creatinine 1.77, BUN 74.  UA unremarkable, chest x-ray pending, CT abdomen showed large stool burden concerning for fecal impaction."  Hospital Course:  Patient presented with complaints of diarrhea.  Initial work-up in the ER showed acute kidney injury, hyperkalemia and CT abdomen showed large stool burden with colonic distention.  Her diarrhea was felt secondary to overflow from fecal impaction and patient was treated with enema, MiraLAX and Senokot.  Patient was also given IV fluids.  Elevated potassium and acute kidney injury resolved.  Her ileus remain a concern. Palliative care was consulted as the patient has poor p.o. intake throughout the hospital stay and has evidence of poor nutrition based on her BMI of 18.  CT abdomen was performed on 07/19/2019 due to abdominal distention which  showed persistent ileus no small bowel obstruction no other evidence of colitis and some fluid level in esophagus and gastric fundus.  GI was consulted for further assistance.  Recommended to continue with enema and MiraLAX.  Given report of vomiting NG tube was inserted to decompress the stomach.  Unable to contact the family on 07/19/2019 at noon and left a voicemail. After NG tube repositioning on reevaluation around 11 PM patient was unresponsive with a goal of breathing and CODE BLUE was called due to cardiac arrest.  CPR was performed.  Anesthesia was called for ET tube insertion.  After ROSC patient was started on epi GTT and bicarb gtt.  Bedside on-call provider was able to reach the family and discussed goals of care with family.  Patient was DNR/DNI.  Plan was to focus on comfort measures only until family is able to visit the patient at bedside.  Family arrived and patient was terminally extubated and at 2:03 AM on 08/14/2019 patient passed away.  1.  Diarrhea Fecal impaction Colonic ileus/pseudoobstruction Admission x-ray and CT abdomen showed large stool burden with fecal impaction. Aggressive bowel regimen, enema as well as stool softener were given and manual disimpaction were performed. Currently no resolution of patient's fecal impaction and it appears that the patient is developing colonic ileus versus pseudoobstruction. Repeat CT abdomen performed today on 07/19/2019 shows distal esophagus distention as well as gastric fundus distention without any small bowel dilatation and distention of the colon from cecum to rectum. Possible colonic pseudoobstruction was suggested by radiology. no evidence of bowel obstruction  gastroenterology consulted. Patient was in p.o., NG tube was  inserted IV fluids were continued.  2.  AKI resolved Dehydration Hypertension Sinus tachycardia Continue with IV fluids reduce fluids.  Holding antihypertensive regimen. Patient was monitored on  telemetry  3.  E. coli UTI Treated with oral amoxicillin.  Monitor. Completed 5-day treatment course.  4.  Hypothyroidism. Continue Synthroid.  Changed to IV  5.  NSVT. Monitor on telemetry. If persistent or has frequent PVCs we will get echocardiogram.  6.  Anemia. Etiology not clear. H&H relatively stable since admission. FOBT positive but likely from rectal mucosal injury from hard stool.  7.  Bilateral lower extremity edema. Duplex negative for DVT. TED stockings. Likely third spacing in the setting of poor p.o. intake and chronic malnutrition.  Body mass index is 18.08 kg/m.  Nutrition Problem: Increased nutrient needs Etiology: wound healing Interventions: Interventions: Ensure Enlive (each supplement provides 350kcal and 20 grams of protein), Magic cup, MVI, Prostat   Left hip deep tissue injury. Present on admission Wound care consulted.  Procedures and Results:  NG tube insertion  Consultations:  Palliative care and GI both were not able to see the patient prior to her passing away.  The results of significant diagnostics from this hospitalization (including imaging, microbiology, ancillary and laboratory) are listed below for reference.    Significant Diagnostic Studies: Ct Abdomen Pelvis Wo Contrast  Result Date: 2019-08-05 CLINICAL DATA:  Abdominal pain EXAM: CT ABDOMEN AND PELVIS WITHOUT CONTRAST TECHNIQUE: Multidetector CT imaging of the abdomen and pelvis was performed following the standard protocol without IV contrast. COMPARISON:  11/08/2018 FINDINGS: Lower chest: No acute abnormality. Hepatobiliary: No focal hepatic abnormality. Gallbladder unremarkable. Pancreas: No focal abnormality or ductal dilatation. Spleen: No focal abnormality.  Normal size. Adrenals/Urinary Tract: No visible focal renal or adrenal mass. No hydronephrosis. Urinary bladder unremarkable. Stomach/Bowel: Marked distention of the rectosigmoid colon with large stool burden  concerning for fecal impaction. Stomach and small bowel decompressed, grossly unremarkable. Vascular/Lymphatic: Aortic atherosclerosis. No enlarged abdominal or pelvic lymph nodes. Reproductive: Prior hysterectomy.  No adnexal masses. Other: No free fluid or free air. Musculoskeletal: Chronic L1 compression fracture. No acute bony abnormality. IMPRESSION: Large stool burden and distention of the rectosigmoid colon concerning for fecal impaction. Otherwise no acute findings in the abdomen or pelvis. Aortic atherosclerosis. Electronically Signed   By: Charlett Nose M.D.   On: Aug 05, 2019 08:54   Dg Abd 1 View  Result Date: 07/05/2019 CLINICAL DATA:  83 year old female with NG tube placement. EXAM: ABDOMEN - 1 VIEW COMPARISON:  Earlier radiograph dated 07/19/2019 FINDINGS: Slight interval advancement of the NG tube with side-port at or just distal to the GE junction and tip in the proximal stomach. Further advancement of the tube by an additional 4-5 cm, if possible, recommended for optimal positioning. Dilated colon similar to prior radiographs. IMPRESSION: Minimal interval advancement of the NG tube since the prior radiograph. Electronically Signed   By: Elgie Collard M.D.   On: 07/22/2019 00:10   Dg Abd 1 View  Result Date: 07/19/2019 CLINICAL DATA:  83 year old female status post NG tube placement. EXAM: ABDOMEN - 1 VIEW COMPARISON:  Earlier radiograph dated 07/19/2019 and CT of the abdomen pelvis dated 07/19/2019 FINDINGS: Partially visualized enteric tube with side-port in the region of the gastroesophageal junction and tip in the upper abdomen likely in the proximal stomach. Recommend further advancing of the tube by an additional 5 cm for optimal positioning. Dilated air-filled loops of colon again noted. IMPRESSION: Enteric tube with side-port in the region of the gastroesophageal junction and  tip in the upper abdomen likely in the proximal stomach. Recommend further advancing of the tube by an  additional 5 cm for optimal positioning. Electronically Signed   By: Elgie CollardArash  Radparvar M.D.   On: 07/19/2019 23:21   Dg Abd 1 View  Result Date: 07/19/2019 CLINICAL DATA:  NG tube placement EXAM: ABDOMEN - 1 VIEW COMPARISON:  None. FINDINGS: Tip the NG tube is is seen projecting coiled again above the level of the diaphragm, likely within a hiatal hernia with the tip projecting at the GE junction. Again noted are markedly dilated air-filled loops of bowel throughout the abdomen. IMPRESSION: Tip the NG tube again seen above the level of the diaphragm with the tip likely projected at the GE junction as on prior exam. Electronically Signed   By: Jonna ClarkBindu  Avutu M.D.   On: 07/19/2019 22:21   Dg Abd 1 View  Result Date: 07/19/2019 CLINICAL DATA:  83 year old female status post NG placement. EXAM: ABDOMEN - 1 VIEW COMPARISON:  Earlier radiograph dated 07/18/2019 FINDINGS: Partially visualized enteric tube appears kinked at the side port. The side port of the tube is located above the diaphragm likely in the hiatal hernia and the tip is in the region of the gastroesophageal junction. Recommended repositioning. Distended colon similar to prior radiograph and CT. IMPRESSION: 1. Enteric tube with side-port in the hiatal hernia and tip close to the GE junction. The tube appears kinked at the side port within the hernia. Recommended repositioning. 2. Distended colon similar to prior radiograph. Electronically Signed   By: Elgie CollardArash  Radparvar M.D.   On: 07/19/2019 20:45   Ct Abdomen Pelvis W Contrast  Result Date: 07/19/2019 CLINICAL DATA:  Evaluate abdominal distension. EXAM: CT ABDOMEN AND PELVIS WITH CONTRAST TECHNIQUE: Multidetector CT imaging of the abdomen and pelvis was performed using the standard protocol following bolus administration of intravenous contrast. CONTRAST:  100mL OMNIPAQUE IOHEXOL 300 MG/ML  SOLN COMPARISON:  07/01/2019 FINDINGS: Lower chest: Small bilateral pleural effusions identified left  greater than right. These are new from previous exam. Hepatobiliary: No focal liver abnormality identified. Mild periportal edema identified. The gallbladder is unremarkable. No gallbladder wall inflammation. No significant bile duct dilatation. Pancreas: Unremarkable. No pancreatic ductal dilatation or surrounding inflammatory changes. Spleen: Normal in size without focal abnormality. Adrenals/Urinary Tract: Normal adrenal glands. Asymmetric left renal atrophy. Bilateral kidney lesions of varying size are identified most of these are too small to reliably characterize but likely represent simple cysts. No enhancing mass noted at this time. Stomach/Bowel: The distal esophagus is distended with air-fluid level. There is also distension of the gastric fundus. No significant small bowel dilatation, inflammation or wall thickening. Marked distension of the colon from the cecum to the level of the rectum. Liquefied stool with air-fluid levels are identified throughout the dilated bowel loops. The previous large volume desiccated stool ball involving the sigmoid colon and rectum now appears fragmented and decreased in size. Vascular/Lymphatic: Aortic atherosclerosis. No aneurysm. No abdominopelvic adenopathy. Reproductive: Status post hysterectomy. No adnexal masses. Other: No free fluid or fluid collections. No pneumoperitoneum identified. Musculoskeletal: Previous hardware fixation of the proximal right femur. Unchanged appearance of fracture deformity involving L1 vertebra appear IMPRESSION: 1. Marked distension of the colon from the cecum to the level of the rectum. Liquefied stool with air-fluid levels identified throughout the dilated bowel loops. Findings may reflect colonic pseudoobstruction secondary to oral cathartics. 2. The previous large volume desiccated stool ball involving the sigmoid colon and rectum now appears fragmented and decreased in size  from previous exam. 3. New small bilateral pleural effusions  left greater than right. Aortic Atherosclerosis (ICD10-I70.0). Electronically Signed   By: Kerby Moors M.D.   On: 07/19/2019 11:25   Dg Chest Port 1 View  Result Date: 07/11/2019 CLINICAL DATA:  Initial evaluation for intubation, code blue. EXAM: PORTABLE CHEST 1 VIEW COMPARISON:  Prior radiograph from 07/19/2019. FINDINGS: Endotracheal tube in place with tip positioned 2.7 cm above the carina. Enteric tube courses into the abdomen, tip overlying the stomach, side hole at the region of the GE junction, similar to previous. Defibrillator pad overlies the left chest. Lungs are hypoinflated. Secondary diffuse bronchovascular crowding with mild bibasilar atelectasis. Perihilar vascular congestion without overt pulmonary edema. No visible pleural effusion. No pneumothorax. No focal infiltrates. Multiple dilated gas-filled loops of colon again seen within the visualized abdomen, not significantly changed in appearance as compared to previous. Multiple right-sided rib fractures again noted, stable. No acute osseous abnormality. IMPRESSION: 1. Tip of the endotracheal tube 2.7 cm above the carina. 2. Enteric tube courses into the abdomen, tip overlying the stomach, side hole at the region of the GE junction, similar to previous. 3. Shallow lung inflation with perihilar vascular congestion and mild bibasilar atelectasis. 4. Multiple prominent gas-filled loops of colon within the visualized abdomen, similar to previous. Electronically Signed   By: Jeannine Boga M.D.   On: 07/19/2019 03:39   Dg Chest Port 1 View  Result Date: 13-Jul-2019 CLINICAL DATA:  Leukocytosis. EXAM: PORTABLE CHEST 1 VIEW COMPARISON:  Chest CT scan dated 11/08/2018 FINDINGS: The heart size and pulmonary vascularity are normal. Aortic atherosclerosis. Multiple old healed right posterior rib fractures with adjacent pleural thickening as demonstrated on the prior CT scan. No infiltrates or effusions. No acute bone abnormality. IMPRESSION:  1. No acute abnormalities. 2. Aortic atherosclerosis. 3. Multiple old healed right posterior rib fractures with adjacent pleural thickening. Electronically Signed   By: Lorriane Shire M.D.   On: 13-Jul-2019 11:40   Dg Abd Portable 1v  Result Date: 06/28/2019 CLINICAL DATA:  Initial evaluation status post code blue. EXAM: PORTABLE ABDOMEN - 1 VIEW COMPARISON:  Prior radiograph from 07/19/2019. FINDINGS: Lateral decubitus view of the abdomen demonstrates no definite free intraperitoneal air. Multiple prominent dilated gas-filled loops of colon with a few scattered air-fluid levels seen, similar to previous. Enteric to tip overlies the stomach, side hole near the GE junction. Defibrillator pads partially visualized. Suture material overlies the mid pelvis. IMPRESSION: 1. No visible free intraperitoneal air. 2. Multiple dilated gas-filled loops of colon with a few scattered air-fluid levels, similar to previous. 3. Enteric tube tip overlies the stomach, side hole near the GE junction. Consider advancement to insure adequate placement within the stomach. Electronically Signed   By: Jeannine Boga M.D.   On: 07/18/2019 03:42   Dg Abd Portable 1v  Result Date: 07/18/2019 CLINICAL DATA:  Abdomen pain and distension EXAM: PORTABLE ABDOMEN - 1 VIEW COMPARISON:  07/17/2019, 07/16/2019, 07/15/2019 FINDINGS: Persistent large rectosigmoid stool burden. Continued gaseous dilatation of the colon, appears slightly decreased. IMPRESSION: Gaseous dilatation of colon appears slightly decreased, persistent large rectosigmoid stool burden. Electronically Signed   By: Donavan Foil M.D.   On: 07/18/2019 16:26   Dg Abd Portable 1v  Result Date: 07/17/2019 CLINICAL DATA:  Constipation. EXAM: PORTABLE ABDOMEN - 1 VIEW COMPARISON:  Radiograph yesterday. CT 07-13-19 FINDINGS: Large rectosigmoid stool burden, however slight decrease from yesterday. There is unchanged gaseous distention of colon. No small bowel dilatation.  No evidence of free  air. IMPRESSION: 1. Large rectosigmoid stool burden, however slight decrease from yesterday. 2. Unchanged gaseous colonic distension. Electronically Signed   By: Narda Rutherford M.D.   On: 07/17/2019 05:57   Dg Abd Portable 1v  Result Date: 07/16/2019 CLINICAL DATA:  Abdominal distension, nausea and vomiting this morning. EXAM: PORTABLE ABDOMEN - 1 VIEW COMPARISON:  Single-view of the abdomen 07/15/2019. CT abdomen and pelvis 07/18/2019. FINDINGS: There is gaseous distention of the colon. A massive volume of stool and is again seen in the rectosigmoid colon. Small bowel is unremarkable. IMPRESSION: Massive volume of stool in the rectosigmoid colon. Gaseous distention of the colon is worrisome for secondary obstruction or ileus. Electronically Signed   By: Drusilla Kanner M.D.   On: 07/16/2019 08:46   Dg Abd Portable 1v  Result Date: 07/15/2019 CLINICAL DATA:  Diarrhea and fevers EXAM: PORTABLE ABDOMEN - 1 VIEW COMPARISON:  07/04/2019 FINDINGS: Scattered large and small bowel gas is noted. Changes consistent with prominent stool burden in the rectosigmoid and fecal impaction are again seen similar to that noted on prior CT. No free air is noted. IMPRESSION: Stable changes of impaction. Electronically Signed   By: Alcide Clever M.D.   On: 07/15/2019 12:58   Vas [place] Lower Extremity Venous (dvt)  Result Date: 07/19/2019  Lower Venous Study Indications: Edema, and Pain.  Comparison Study: no prior Performing Technologist: Blanch Media RVS  Examination Guidelines: A complete evaluation includes B-mode imaging, spectral Doppler, color Doppler, and power Doppler as needed of all accessible portions of each vessel. Bilateral testing is considered an integral part of a complete examination. Limited examinations for reoccurring indications may be performed as noted.  +---------+---------------+---------+-----------+----------+--------------+  RIGHT      Compressibility Phasicity Spontaneity Properties Thrombus Aging  +---------+---------------+---------+-----------+----------+--------------+  CFV       Full            Yes       Yes                                    +---------+---------------+---------+-----------+----------+--------------+  SFJ       Full                                                             +---------+---------------+---------+-----------+----------+--------------+  FV Prox   Full                                                             +---------+---------------+---------+-----------+----------+--------------+  FV Mid    Full                                                             +---------+---------------+---------+-----------+----------+--------------+  FV Distal Full                                                             +---------+---------------+---------+-----------+----------+--------------+  PFV       Full                                                             +---------+---------------+---------+-----------+----------+--------------+  POP       Full            Yes       Yes                                    +---------+---------------+---------+-----------+----------+--------------+  PTV       Full                                                             +---------+---------------+---------+-----------+----------+--------------+  PERO      Full                                                             +---------+---------------+---------+-----------+----------+--------------+   +---------+---------------+---------+-----------+----------+--------------+  LEFT      Compressibility Phasicity Spontaneity Properties Thrombus Aging  +---------+---------------+---------+-----------+----------+--------------+  CFV       Full            Yes       Yes                                    +---------+---------------+---------+-----------+----------+--------------+  SFJ       Full                                                              +---------+---------------+---------+-----------+----------+--------------+  FV Prox   Full                                                             +---------+---------------+---------+-----------+----------+--------------+  FV Mid    Full                                                             +---------+---------------+---------+-----------+----------+--------------+  FV Distal Full                                                             +---------+---------------+---------+-----------+----------+--------------+  PFV       Full                                                             +---------+---------------+---------+-----------+----------+--------------+  POP       Full            Yes       Yes                                    +---------+---------------+---------+-----------+----------+--------------+  PTV       Full                                                             +---------+---------------+---------+-----------+----------+--------------+  PERO                                                       Not visualized  +---------+---------------+---------+-----------+----------+--------------+     Summary: Right: There is no evidence of deep vein thrombosis in the lower extremity. No cystic structure found in the popliteal fossa. Left: There is no evidence of deep vein thrombosis in the lower extremity. No cystic structure found in the popliteal fossa.  *See table(s) above for measurements and observations. Electronically signed by Sherald Hess MD on 07/19/2019 at 6:36:51 PM.    Final     Microbiology: Recent Results (from the past 240 hour(s))  Culture, Urine     Status: Abnormal   Collection Time: 07/11/19  9:44 AM   Specimen: Urine, Catheterized  Result Value Ref Range Status   Specimen Description   Final    URINE, CATHETERIZED Performed at [facility], [address] [place], [place] [address]    Special Requests   Final     NONE Performed at [facility], [address] [place], [place] [address]    Culture >=100,000 COLONIES/mL ESCHERICHIA COLI (A)  Final   Report Status 07/13/2019 FINAL  Final   Organism ID, Bacteria ESCHERICHIA COLI (A)  Final      Susceptibility   Escherichia coli - MIC*    AMPICILLIN <=2 SENSITIVE Sensitive     CEFAZOLIN <=4 SENSITIVE Sensitive     CEFTRIAXONE <=1 SENSITIVE Sensitive     CIPROFLOXACIN <=0.25 SENSITIVE Sensitive     GENTAMICIN <=1 SENSITIVE Sensitive     IMIPENEM <=0.25 SENSITIVE Sensitive     NITROFURANTOIN <=16 SENSITIVE Sensitive     TRIMETH/SULFA <=20 SENSITIVE Sensitive     AMPICILLIN/SULBACTAM <=2 SENSITIVE Sensitive     PIP/TAZO <=4 SENSITIVE Sensitive     Extended ESBL NEGATIVE Sensitive     * >=100,000 COLONIES/mL ESCHERICHIA COLI  SARS CORONAVIRUS 2 (TAT 6-24 HRS) Nasopharyngeal Nasopharyngeal Swab     Status: None   Collection Time: 07/14/19  9:59 AM   Specimen: Nasopharyngeal Swab  Result Value Ref Range Status   SARS Coronavirus  2 NEGATIVE NEGATIVE Final    Comment: (NOTE) SARS-CoV-2 target nucleic acids are NOT DETECTED. The SARS-CoV-2 RNA is generally detectable in upper and lower respiratory specimens during the acute phase of infection. Negative results do not preclude SARS-CoV-2 infection, do not rule out co-infections with other pathogens, and should not be used as the sole basis for treatment or other patient management decisions. Negative results must be combined with clinical observations, patient history, and epidemiological information. The expected result is Negative. Fact Sheet for Patients: HairSlick.no Fact Sheet for Healthcare Providers: quierodirigir.com This test is not yet approved or cleared by the [place] FDA and  has been authorized for detection and/or diagnosis of SARS-CoV-2 by FDA under an Emergency Use Authorization (EUA). This EUA will  remain  in effect (meaning this test can be used) for the duration of the COVID-19 declaration under Section 56 4(b)(1) of the Act, 21 U.S.C. section 360bbb-3(b)(1), unless the authorization is terminated or revoked sooner. Performed at [facility], [address], [place], [place] [address]      Labs: CBC: Recent Labs  Lab 07/15/19 1157 07/16/19 1650 07/17/19 0354 07/19/19 0436 07/22/2019 0018  WBC 8.3 11.4* 11.3* 8.7 9.2  NEUTROABS  --  9.2* 9.5*  --   --   HGB 8.2* 8.2* 8.4* 8.1* 7.4*  HCT 26.0* 26.5* 27.7* 25.9* 25.5*  MCV 94.2 94.3 96.5 96.3 102.8*  PLT 249 308 321 385 222   Basic Metabolic Panel: Recent Labs  Lab 07/16/19 0820 07/16/19 1650 07/17/19 0354 07/19/19 0436 07/15/2019 0018  NA 136 139 141 144 148*  K 3.5 3.7 3.7 3.3* 4.8  CL 106 107 108 110 109  CO2 21* GLUCOSE 126* 113* 126* 103* 265*  BUN 33* 33* 39* 29* 24*  CREATININE 0.75 0.78 0.87 0.70 1.05*  CALCIUM 8.2* 8.2* 8.6* 8.5* 12.0*  MG 2.2 2.0 2.3 2.3 3.0*   Liver Function Tests: Recent Labs  Lab 07/15/19 1157 07/16/19 0820 07/17/19 0354 07/23/2019 0018  AST 9* 12* 18 145*  ALT <5  ALKPHOS 60 66 70 65  BILITOT 0.4 0.6 0.8 0.3  PROT 4.4* 4.9* 5.1* 4.2*  ALBUMIN 1.7* 1.9* 2.0* 1.5*   Recent Labs  Lab 07/16/19 0820  LIPASE 23   No results for input(s): AMMONIA in the last 168 hours. Cardiac Enzymes: No results for input(s): CKTOTAL, CKMB, CKMBINDEX, TROPONINI in the last 168 hours. BNP (last 3 results) No results for input(s): BNP in the last 8760 hours. CBG: Recent Labs  Lab 07/19/19 0011 07/19/19 0747 07/19/19 1650 07/19/19 2340 07/19/19 2345  GLUCAP 74 114* 150* 284* 275*   Time spent: 15 minutes  Signed:  Lynden Oxford  [facility] 06/28/2019,

## 2019-07-27 DEATH — deceased

## 2020-02-03 IMAGING — DX DG ABD PORTABLE 1V
1 series · 2 of 2 positions shown · non-contrast
Comparison: Radiograph yesterday. CT 07/09/2019

CLINICAL DATA: Constipation.

EXAM:
PORTABLE ABDOMEN - 1 VIEW

[Series 2: abdomen kub · 0.14mm/px · 2 of 2 slices shown]
[im 1/2]
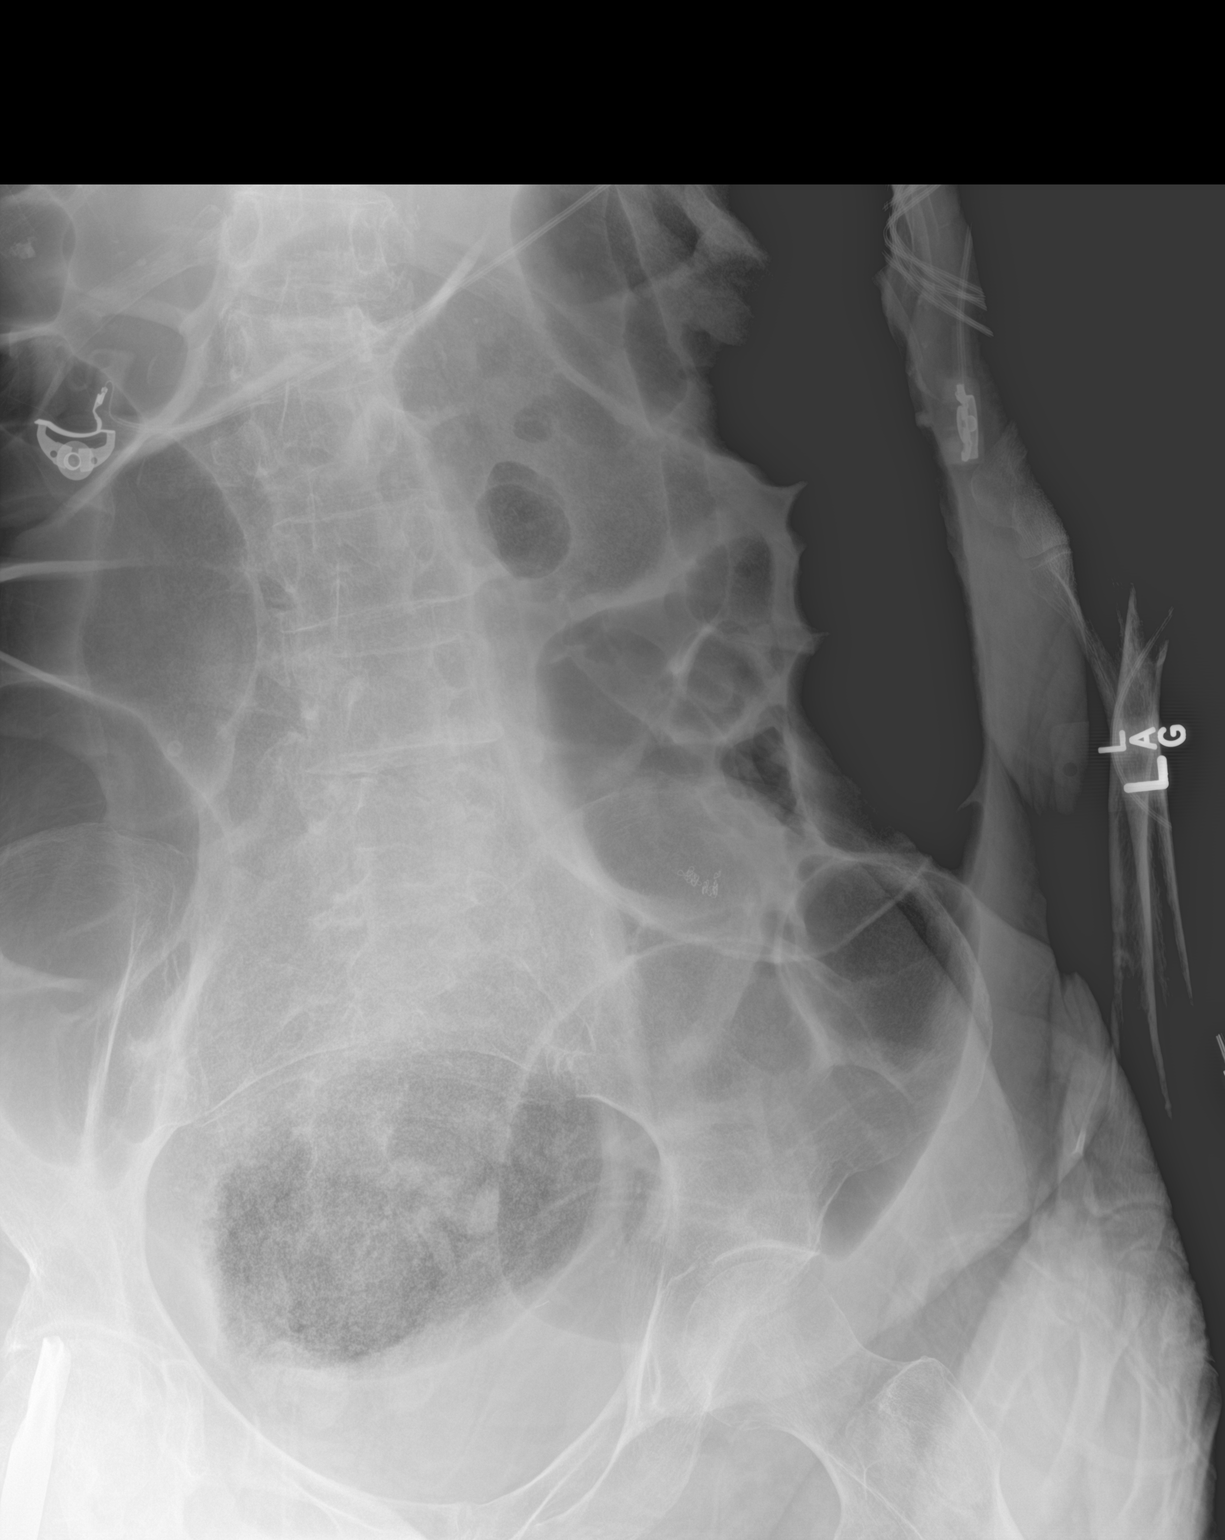
[im 2/2]
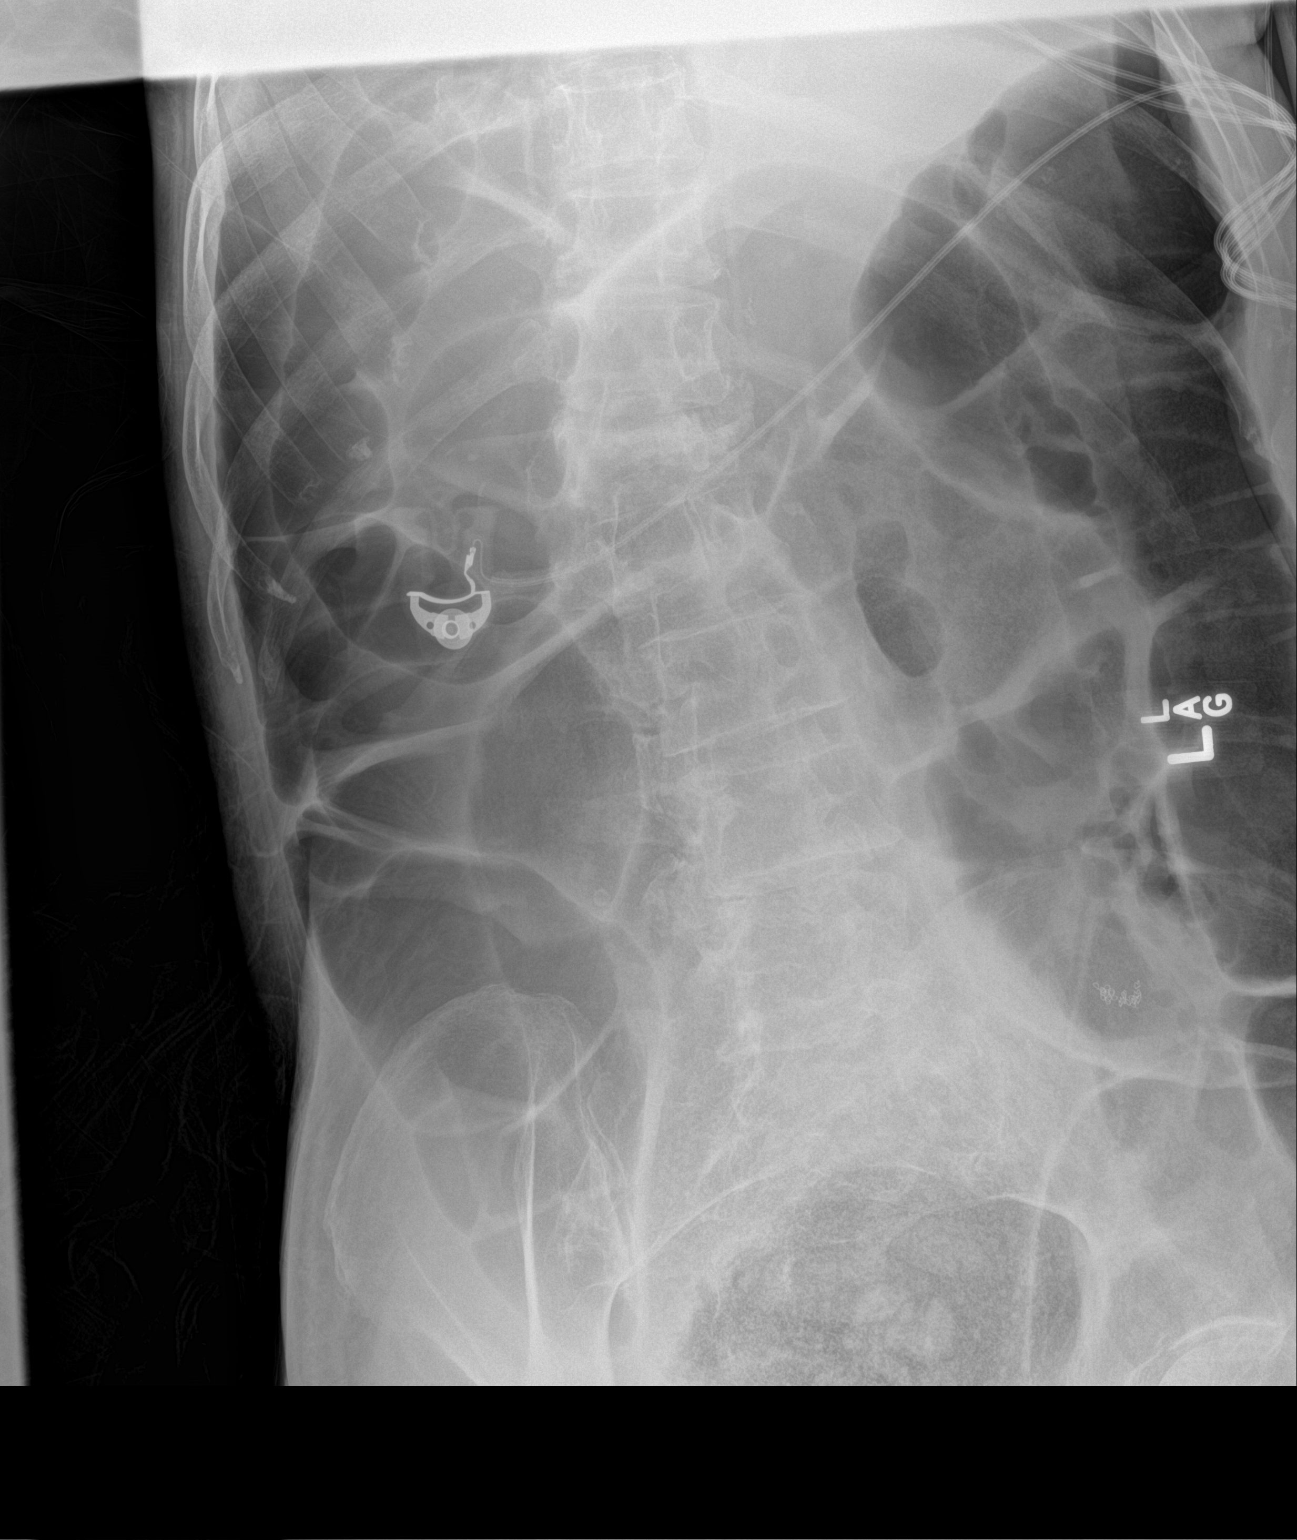

[2 of 2 positions shown; findings below may reference images not displayed]

FINDINGS: Large rectosigmoid stool burden, however slight decrease from
yesterday. There is unchanged gaseous distention of colon. No small
bowel dilatation. No evidence of free air.
IMPRESSION: 1. Large rectosigmoid stool burden, however slight decrease from
yesterday.
2. Unchanged gaseous colonic distension.

## 2020-02-05 IMAGING — DX DG CHEST 1V PORT
1 series · 1 of 1 positions shown · non-contrast
Comparison: Prior radiograph from 07/19/2019.

CLINICAL DATA: Initial evaluation for intubation, code blue.

EXAM:
PORTABLE CHEST 1 VIEW

[chest ap]
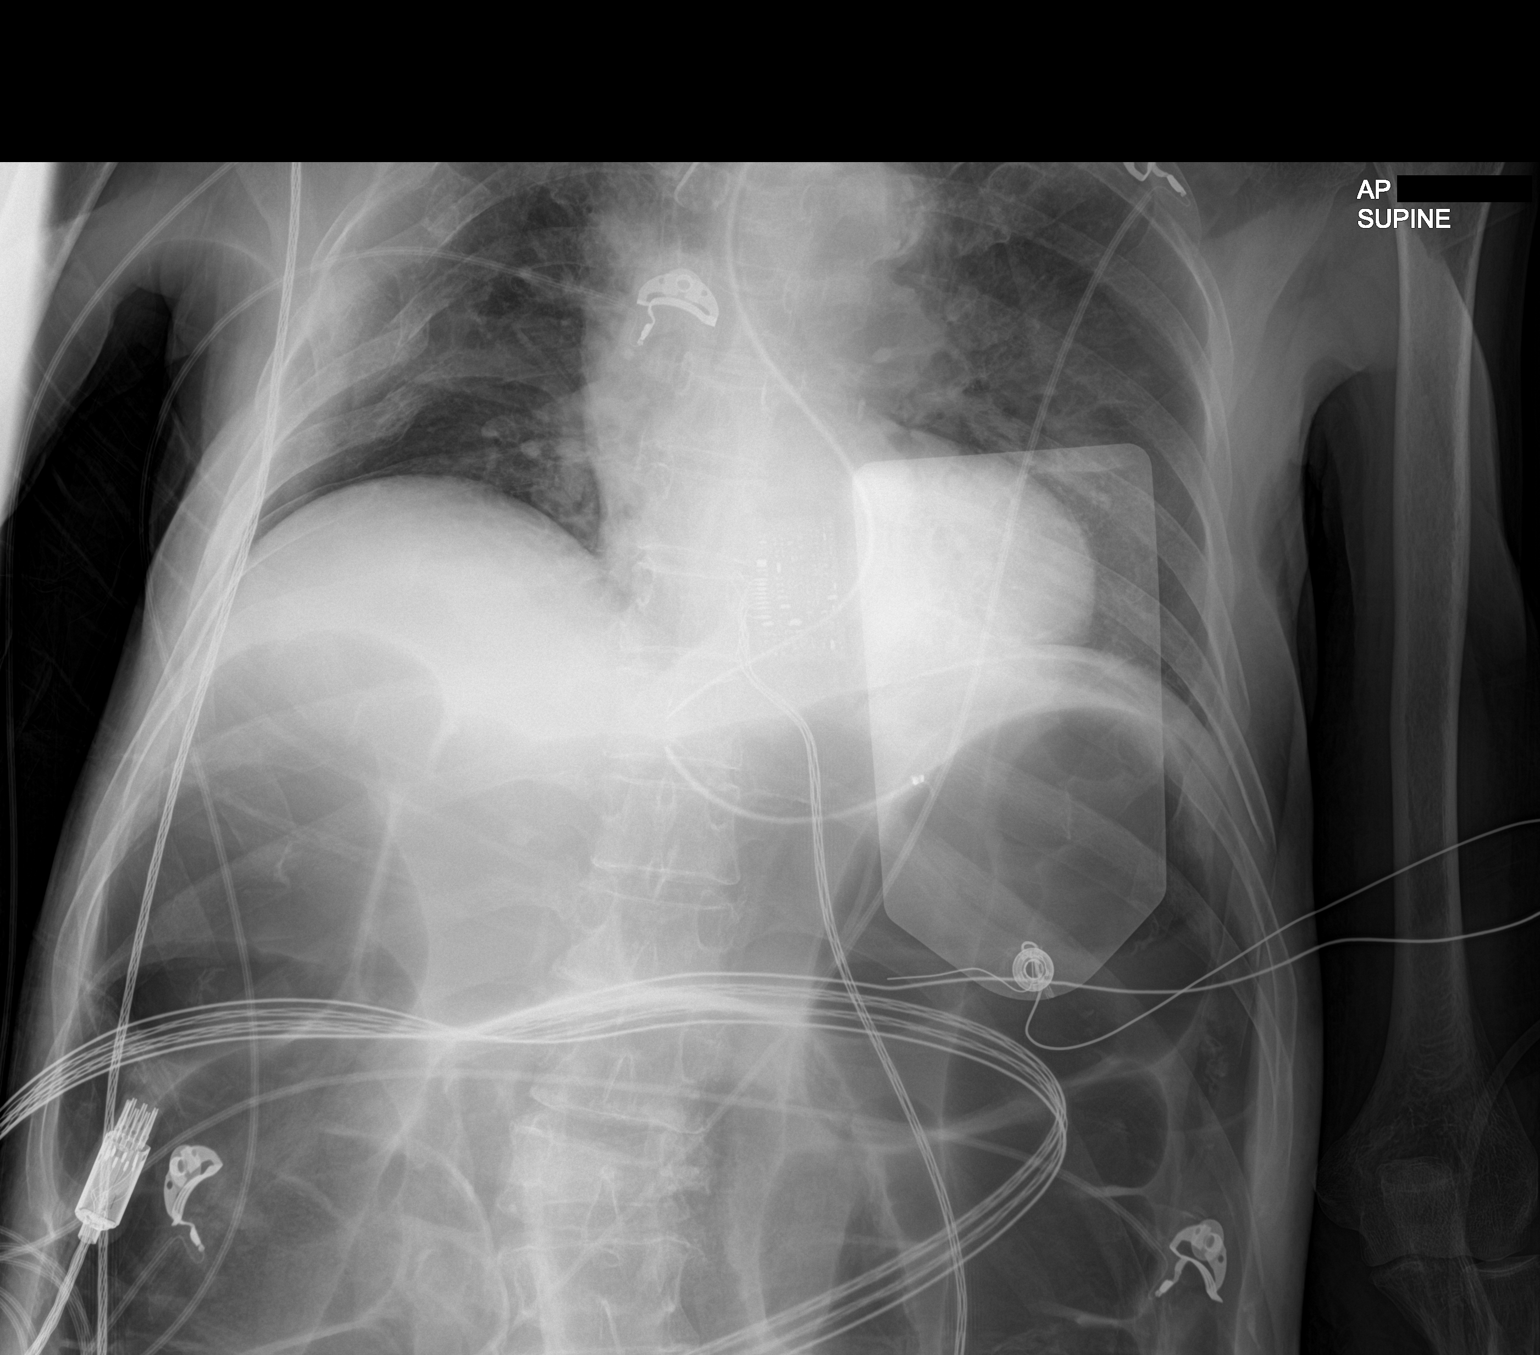

[1 of 1 positions shown; findings below may reference images not displayed]

FINDINGS: Endotracheal tube in place with tip positioned 2.7 cm above the
carina. Enteric tube courses into the abdomen, tip overlying the
stomach, side hole at the region of the GE junction, similar to
previous. Defibrillator pad overlies the left chest.

Lungs are hypoinflated. Secondary diffuse bronchovascular crowding
with mild bibasilar atelectasis. Perihilar vascular congestion
without overt pulmonary edema. No visible pleural effusion. No
pneumothorax. No focal infiltrates.

Multiple dilated gas-filled loops of colon again seen within the
visualized abdomen, not significantly changed in appearance as
compared to previous.

Multiple right-sided rib fractures again noted, stable. No acute
osseous abnormality.
IMPRESSION: 1. Tip of the endotracheal tube 2.7 cm above the carina.
2. Enteric tube courses into the abdomen, tip overlying the stomach,
side hole at the region of the GE junction, similar to previous.
3. Shallow lung inflation with perihilar vascular congestion and
mild bibasilar atelectasis.
4. Multiple prominent gas-filled loops of colon within the
visualized abdomen, similar to previous.
